# Patient Record
Sex: Male | Born: 1965 | Race: White | Hispanic: No | Marital: Single | Smoking: Former smoker
Health system: Southern US, Community
[De-identification: ages and names within clinical notes are randomized; demographics above are authoritative.]

## PROBLEM LIST (undated history)

## (undated) ENCOUNTER — Emergency Department (HOSPITAL_COMMUNITY): Payer: Self-pay

## (undated) DIAGNOSIS — E119 Type 2 diabetes mellitus without complications: Secondary | ICD-10-CM

## (undated) DIAGNOSIS — K219 Gastro-esophageal reflux disease without esophagitis: Secondary | ICD-10-CM

## (undated) DIAGNOSIS — I10 Essential (primary) hypertension: Secondary | ICD-10-CM

## (undated) DIAGNOSIS — F419 Anxiety disorder, unspecified: Secondary | ICD-10-CM

## (undated) DIAGNOSIS — R011 Cardiac murmur, unspecified: Secondary | ICD-10-CM

## (undated) HISTORY — DX: Essential (primary) hypertension: I10

## (undated) HISTORY — DX: Anxiety disorder, unspecified: F41.9

## (undated) HISTORY — DX: Type 2 diabetes mellitus without complications: E11.9

## (undated) HISTORY — DX: Cardiac murmur, unspecified: R01.1

## (undated) HISTORY — DX: Gastro-esophageal reflux disease without esophagitis: K21.9

## (undated) HISTORY — PX: FRACTURE SURGERY: SHX138

---

## 1998-07-19 ENCOUNTER — Emergency Department (HOSPITAL_COMMUNITY): Admission: EM | Admit: 1998-07-19 | Discharge: 1998-07-19 | Payer: Self-pay | Admitting: Emergency Medicine

## 1998-07-19 ENCOUNTER — Encounter: Payer: Self-pay | Admitting: Emergency Medicine

## 2005-05-20 ENCOUNTER — Emergency Department (HOSPITAL_COMMUNITY): Admission: EM | Admit: 2005-05-20 | Discharge: 2005-05-21 | Payer: Self-pay | Admitting: *Deleted

## 2006-05-07 ENCOUNTER — Emergency Department (HOSPITAL_COMMUNITY): Admission: EM | Admit: 2006-05-07 | Discharge: 2006-05-07 | Payer: Self-pay | Admitting: Emergency Medicine

## 2008-01-06 IMAGING — CR DG ELBOW COMPLETE 3+V*L*
4 series · 4 of 4 positions shown · non-contrast
Comparison: none

CLINICAL DATA: Fall, pain and swelling.
LEFT ELBOW ? 4 VIEW:

[x elbow joint ap left]
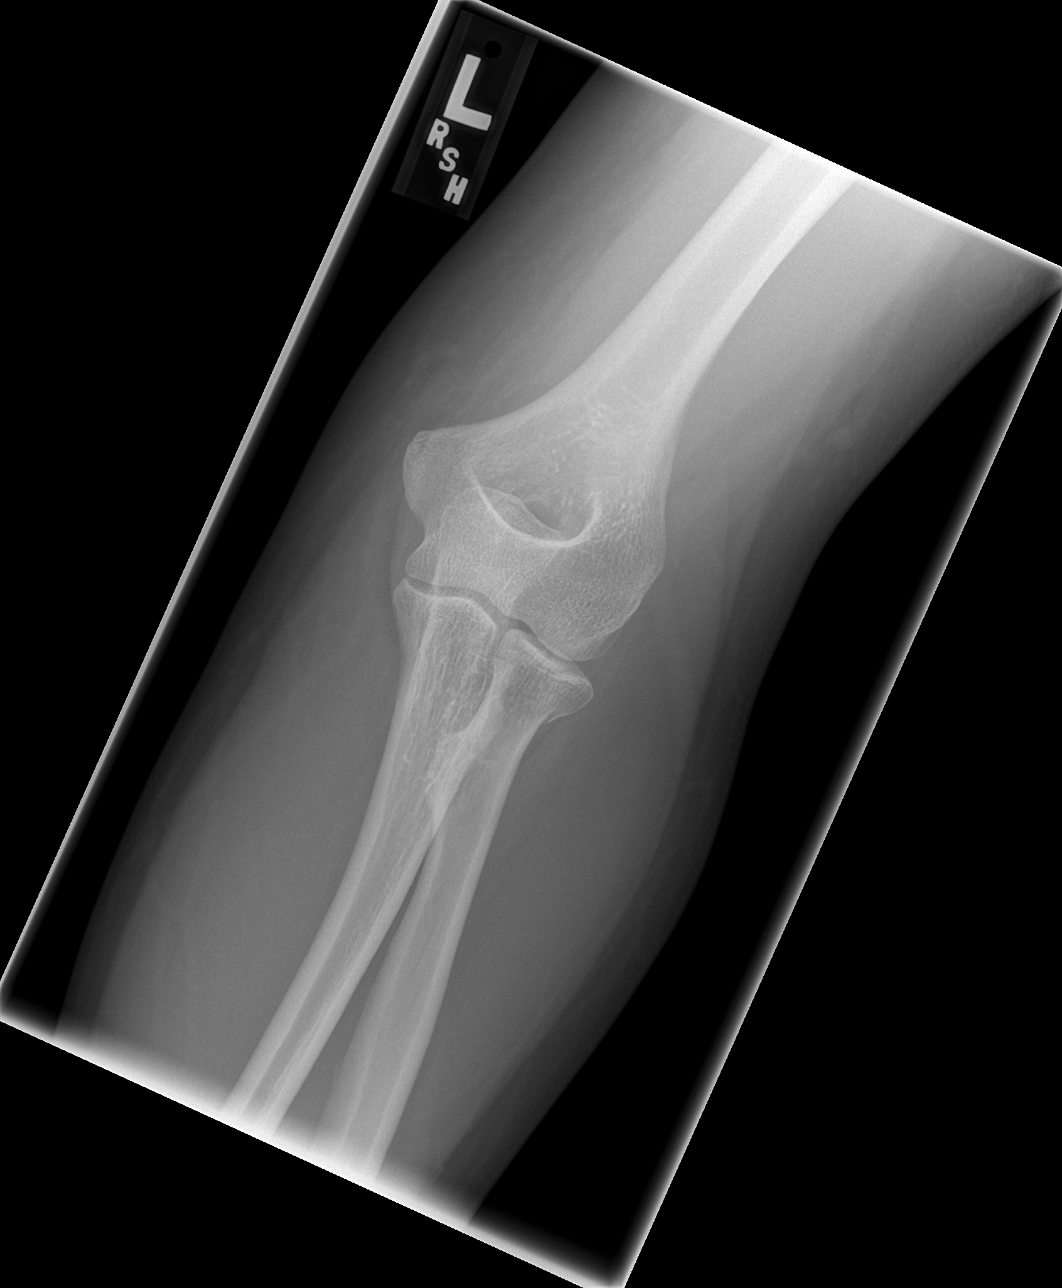

[x elbow joint obl. left (1 of 2)]
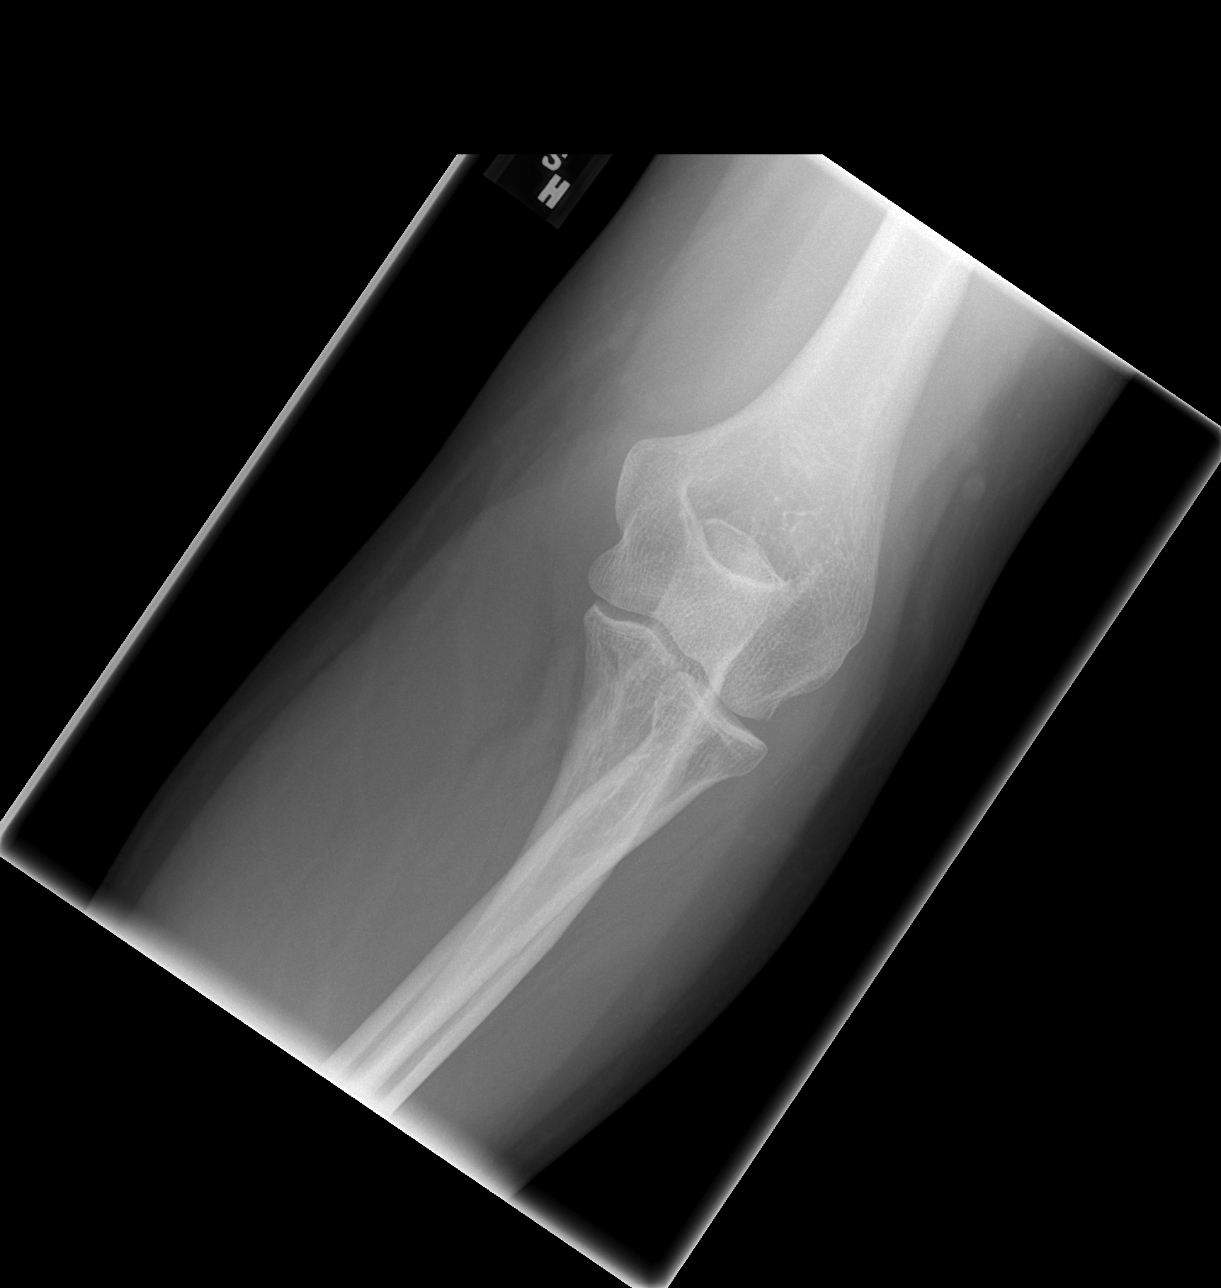

[x elbow joint obl. left (2 of 2)]
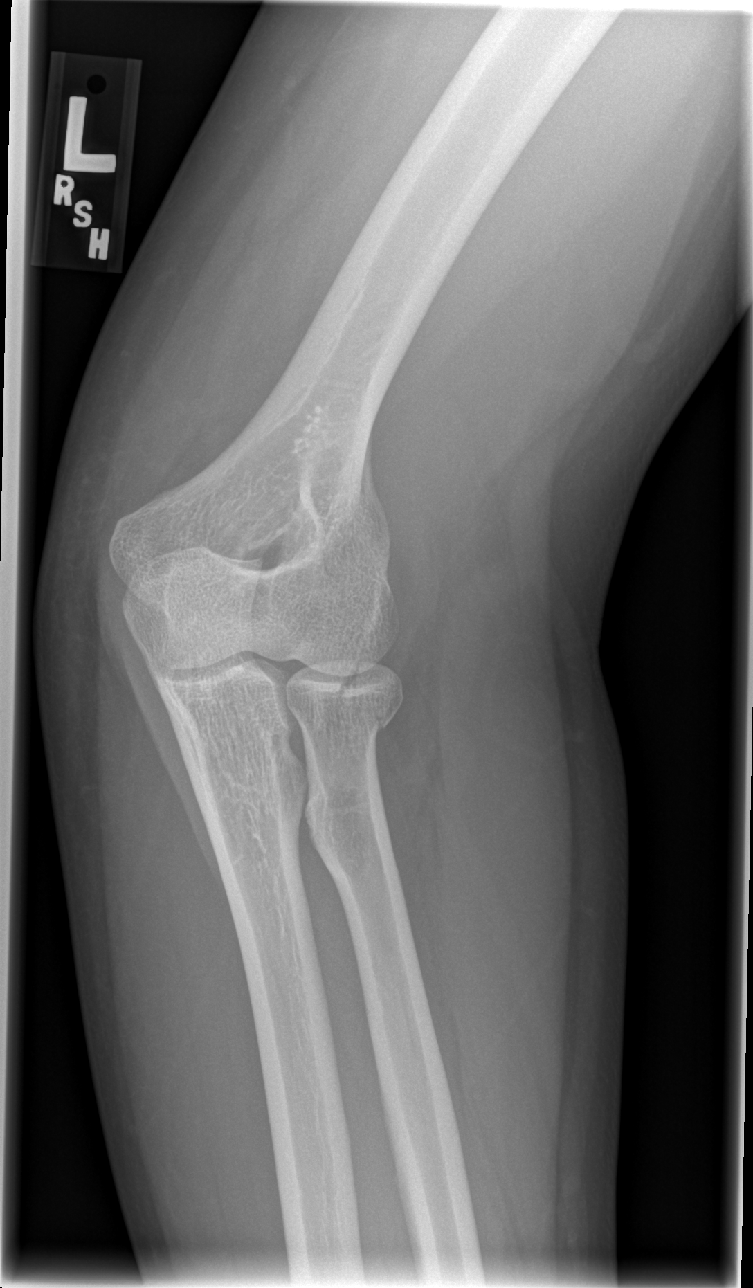

[x elbow joint lat left]
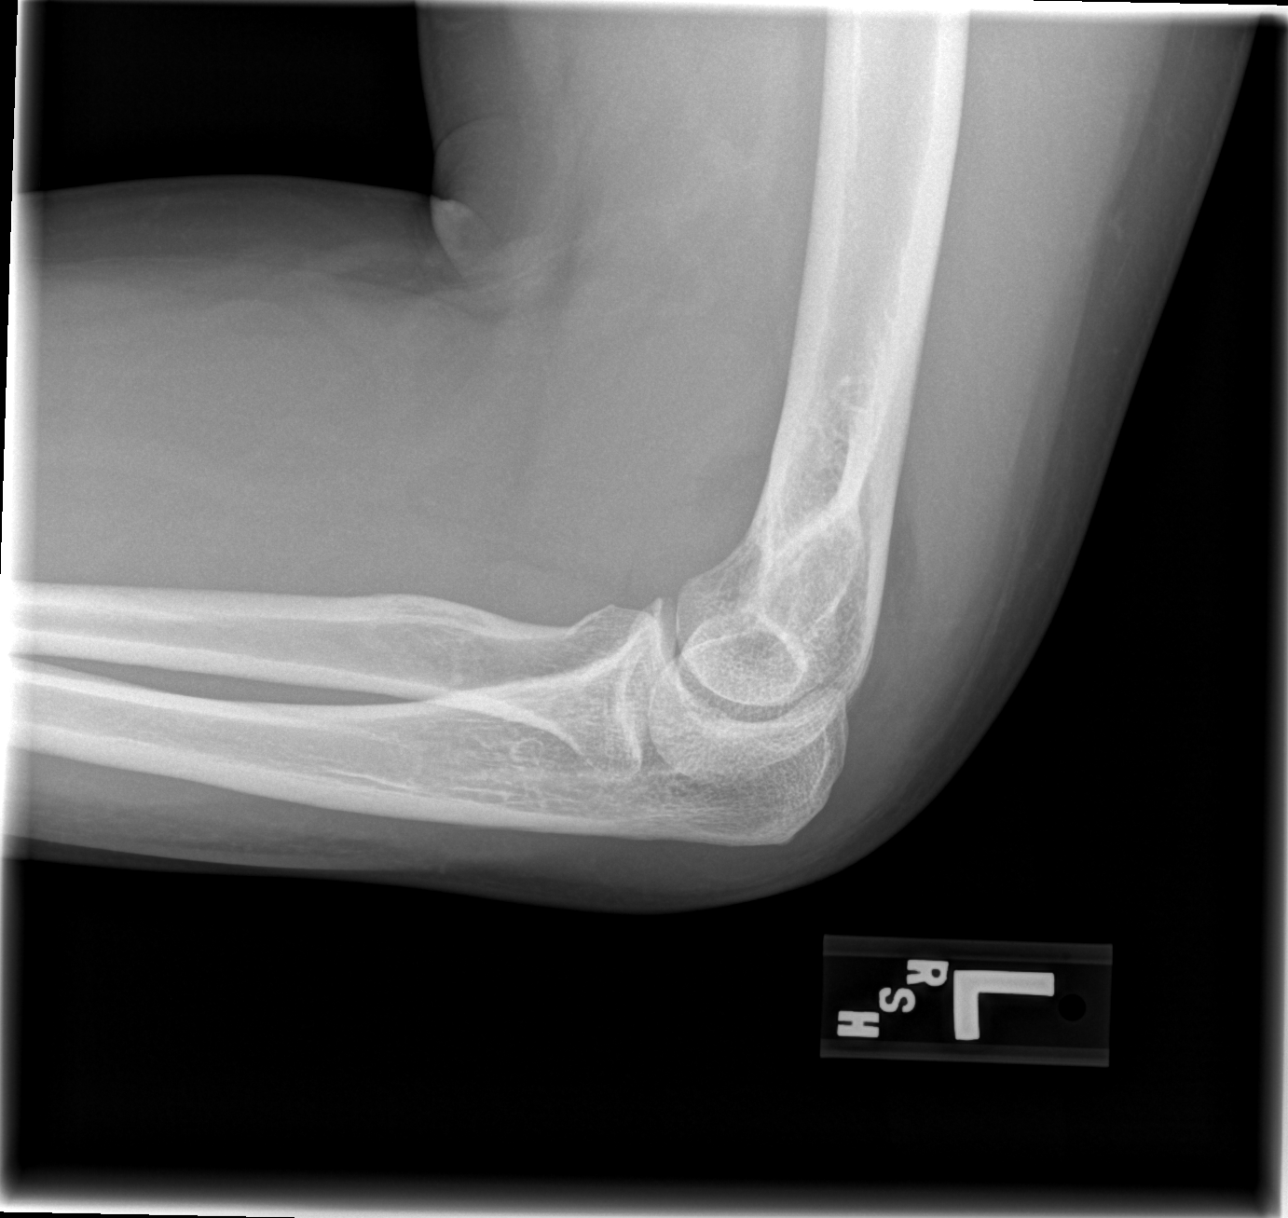

[4 of 4 positions shown; findings below may reference images not displayed]

FINDINGS: There is an acute minimally displaced fracture of the radial head. Alignment is anatomic to the capitellum. The humerus and ulna are intact and aligned. On the lateral view, there is an acute joint effusion/hemarthrosis.
IMPRESSION: Acute radial head fracture with joint effusion.

## 2014-12-08 ENCOUNTER — Ambulatory Visit (INDEPENDENT_AMBULATORY_CARE_PROVIDER_SITE_OTHER): Payer: No Typology Code available for payment source | Admitting: Emergency Medicine

## 2014-12-08 ENCOUNTER — Other Ambulatory Visit: Payer: Self-pay | Admitting: Emergency Medicine

## 2014-12-08 VITALS — BP 184/100 | HR 101 | Temp 97.7°F | Resp 16 | Ht 67.0 in | Wt 260.4 lb

## 2014-12-08 DIAGNOSIS — G44039 Episodic paroxysmal hemicrania, not intractable: Secondary | ICD-10-CM

## 2014-12-08 DIAGNOSIS — E118 Type 2 diabetes mellitus with unspecified complications: Secondary | ICD-10-CM

## 2014-12-08 DIAGNOSIS — E1165 Type 2 diabetes mellitus with hyperglycemia: Secondary | ICD-10-CM

## 2014-12-08 DIAGNOSIS — I10 Essential (primary) hypertension: Secondary | ICD-10-CM | POA: Diagnosis not present

## 2014-12-08 DIAGNOSIS — G479 Sleep disorder, unspecified: Secondary | ICD-10-CM

## 2014-12-08 DIAGNOSIS — E119 Type 2 diabetes mellitus without complications: Secondary | ICD-10-CM | POA: Insufficient documentation

## 2014-12-08 DIAGNOSIS — IMO0002 Reserved for concepts with insufficient information to code with codable children: Secondary | ICD-10-CM

## 2014-12-08 LAB — POCT CBC
Granulocyte percent: 68.1 %G (ref 37–80)
HCT, POC: 50.7 % (ref 43.5–53.7)
Hemoglobin: 17.3 g/dL (ref 14.1–18.1)
Lymph, poc: 1.7 (ref 0.6–3.4)
MCH, POC: 31.6 pg — AB (ref 27–31.2)
MCHC: 34.2 g/dL (ref 31.8–35.4)
MCV: 92.5 fL (ref 80–97)
MID (cbc): 0.2 (ref 0–0.9)
MPV: 7.9 fL (ref 0–99.8)
POC Granulocyte: 4.1 (ref 2–6.9)
POC LYMPH PERCENT: 27.8 %L (ref 10–50)
POC MID %: 4.1 %M (ref 0–12)
Platelet Count, POC: 204 10*3/uL (ref 142–424)
RBC: 5.48 M/uL (ref 4.69–6.13)
RDW, POC: 13 %
WBC: 6 10*3/uL (ref 4.6–10.2)

## 2014-12-08 LAB — COMPLETE METABOLIC PANEL WITH GFR
ALT: 90 U/L — ABNORMAL HIGH (ref 9–46)
AST: 47 U/L — ABNORMAL HIGH (ref 10–40)
Albumin: 4.7 g/dL (ref 3.6–5.1)
Alkaline Phosphatase: 67 U/L (ref 40–115)
BUN: 11 mg/dL (ref 7–25)
CO2: 24 mmol/L (ref 20–31)
Calcium: 9.8 mg/dL (ref 8.6–10.3)
Chloride: 99 mmol/L (ref 98–110)
Creat: 0.94 mg/dL (ref 0.60–1.35)
GFR, Est African American: >89 mL/min (ref >=60)
GFR, Est Non African American: >89 mL/min (ref >=60)
Glucose, Bld: 374 mg/dL — ABNORMAL HIGH (ref 65–99)
Potassium: 4.5 mmol/L (ref 3.5–5.3)
Sodium: 136 mmol/L (ref 135–146)
Total Bilirubin: 1.1 mg/dL (ref 0.2–1.2)
Total Protein: 6.8 g/dL (ref 6.1–8.1)

## 2014-12-08 LAB — HEMOGLOBIN A1C: HEMOGLOBIN A1C: 10.3 % — AB (ref 4.0–6.0)

## 2014-12-08 LAB — GLUCOSE, POCT (MANUAL RESULT ENTRY): POC Glucose: 398 mg/dl — AB (ref 70–99)

## 2014-12-08 LAB — TSH: TSH: 3.614 u[IU]/mL (ref 0.350–4.500)

## 2014-12-08 LAB — POCT GLYCOSYLATED HEMOGLOBIN (HGB A1C): HEMOGLOBIN A1C: 10.3

## 2014-12-08 MED ORDER — DILTIAZEM HCL ER COATED BEADS 240 MG PO CP24: 240.0000 mg | ORAL_CAPSULE | Freq: Every day | ORAL | Status: DC

## 2014-12-08 MED ORDER — METFORMIN HCL 500 MG PO TABS: ORAL_TABLET | ORAL | Status: DC

## 2014-12-08 NOTE — Patient Instructions (Signed)
Hypertension Hypertension, commonly called high blood pressure, is when the force of blood pumping through your arteries is too strong. Your arteries are the blood vessels that carry blood from your heart throughout your body. A blood pressure reading consists of a higher number over a lower number, such as 110/72. The higher number (systolic) is the pressure inside your arteries when your heart pumps. The lower number (diastolic) is the pressure inside your arteries when your heart relaxes. Ideally you want your blood pressure below 120/80. Hypertension forces your heart to work harder to pump blood. Your arteries may become narrow or stiff. Having untreated or uncontrolled hypertension can cause heart attack, stroke, kidney disease, and other problems. RISK FACTORS Some risk factors for high blood pressure are controllable. Others are not.  Risk factors you cannot control include:   Race. You may be at higher risk if you are African American.  Age. Risk increases with age.  Gender. Men are at higher risk than women before age 45 years. After age 65, women are at higher risk than men. Risk factors you can control include:  Not getting enough exercise or physical activity.  Being overweight.  Getting too much fat, sugar, calories, or salt in your diet.  Drinking too much alcohol. SIGNS AND SYMPTOMS Hypertension does not usually cause signs or symptoms. Extremely high blood pressure (hypertensive crisis) may cause headache, anxiety, shortness of breath, and nosebleed. DIAGNOSIS To check if you have hypertension, your health care provider will measure your blood pressure while you are seated, with your arm held at the level of your heart. It should be measured at least twice using the same arm. Certain conditions can cause a difference in blood pressure between your right and left arms. A blood pressure reading that is higher than normal on one occasion does not mean that you need treatment. If  it is not clear whether you have high blood pressure, you may be asked to return on a different day to have your blood pressure checked again. Or, you may be asked to monitor your blood pressure at home for 1 or more weeks. TREATMENT Treating high blood pressure includes making lifestyle changes and possibly taking medicine. Living a healthy lifestyle can help lower high blood pressure. You may need to change some of your habits. Lifestyle changes may include:  Following the DASH diet. This diet is high in fruits, vegetables, and whole grains. It is low in salt, red meat, and added sugars.  Keep your sodium intake below 2,300 mg per day.  Getting at least 30-45 minutes of aerobic exercise at least 4 times per week.  Losing weight if necessary.  Not smoking.  Limiting alcoholic beverages.  Learning ways to reduce stress. Your health care provider may prescribe medicine if lifestyle changes are not enough to get your blood pressure under control, and if one of the following is true:  You are 18-59 years of age and your systolic blood pressure is above 140.  You are 60 years of age or older, and your systolic blood pressure is above 150.  Your diastolic blood pressure is above 90.  You have diabetes, and your systolic blood pressure is over 140 or your diastolic blood pressure is over 90.  You have kidney disease and your blood pressure is above 140/90.  You have heart disease and your blood pressure is above 140/90. Your personal target blood pressure may vary depending on your medical conditions, your age, and other factors. HOME CARE INSTRUCTIONS    Have your blood pressure rechecked as directed by your health care provider.   Take medicines only as directed by your health care provider. Follow the directions carefully. Blood pressure medicines must be taken as prescribed. The medicine does not work as well when you skip doses. Skipping doses also puts you at risk for  problems.  Do not smoke.   Monitor your blood pressure at home as directed by your health care provider. SEEK MEDICAL CARE IF:   You think you are having a reaction to medicines taken.  You have recurrent headaches or feel dizzy.  You have swelling in your ankles.  You have trouble with your vision. SEEK IMMEDIATE MEDICAL CARE IF:  You develop a severe headache or confusion.  You have unusual weakness, numbness, or feel faint.  You have severe chest or abdominal pain.  You vomit repeatedly.  You have trouble breathing. MAKE SURE YOU:   Understand these instructions.  Will watch your condition.  Will get help right away if you are not doing well or get worse.   This information is not intended to replace advice given to you by your health care provider. Make sure you discuss any questions you have with your health care provider.   Document Released: 01/06/2005 Document Revised: 05/23/2014 Document Reviewed: 10/29/2012 Elsevier Interactive Patient Education 2016 [employer]vier [employer] Type 2 Diabetes Mellitus, Adult Type 2 diabetes mellitus, often simply referred to as type 2 diabetes, is a long-lasting (chronic) disease. In type 2 diabetes, the pancreas does not make enough insulin (a hormone), the cells are less responsive to the insulin that is made (insulin resistance), or both. Normally, insulin moves sugars from food into the tissue cells. The tissue cells use the sugars for energy. The lack of insulin or the lack of normal response to insulin causes excess sugars to build up in the blood instead of going into the tissue cells. As a result, high blood sugar (hyperglycemia) develops. The effect of high sugar (glucose) levels can cause many complications. Type 2 diabetes was also previously called adult-onset diabetes, but it can occur at any age.  RISK FACTORS  A person is predisposed to developing type 2 diabetes if someone in the family has the disease and also has one or  more of the following primary risk factors:  Weight gain, or being overweight or obese.  An inactive lifestyle.  A history of consistently eating high-calorie foods. Maintaining a normal weight and regular physical activity can reduce the chance of developing type 2 diabetes. SYMPTOMS  A person with type 2 diabetes may not show symptoms initially. The symptoms of type 2 diabetes appear slowly. The symptoms include:  Increased thirst (polydipsia).  Increased urination (polyuria).  Increased urination during the night (nocturia).  Sudden or unexplained weight changes.  Frequent, recurring infections.  Tiredness (fatigue).  Weakness.  Vision changes, such as blurred vision.  Fruity smell to your breath.  Abdominal pain.  Nausea or vomiting.  Cuts or bruises which are slow to heal.  Tingling or numbness in the hands or feet.  An open skin wound (ulcer). DIAGNOSIS Type 2 diabetes is frequently not diagnosed until complications of diabetes are present. Type 2 diabetes is diagnosed when symptoms or complications are present and when blood glucose levels are increased. Your blood glucose level may be checked by one or more of the following blood tests:  A fasting blood glucose test. You will not be allowed to eat for at least 8 hours before a blood sample   is taken.  A random blood glucose test. Your blood glucose is checked at any time of the day regardless of when you ate.  A hemoglobin A1c blood glucose test. A hemoglobin A1c test provides information about blood glucose control over the previous 3 months.  An oral glucose tolerance test (OGTT). Your blood glucose is measured after you have not eaten (fasted) for 2 hours and then after you drink a glucose-containing beverage. TREATMENT   You may need to take insulin or diabetes medicine daily to keep blood glucose levels in the desired range.  If you use insulin, you may need to adjust the dosage depending on the  carbohydrates that you eat with each meal or snack.  Lifestyle changes are recommended as part of your treatment. These may include:  Following an individualized diet plan developed by a nutritionist or dietitian.  Exercising daily. Your health care providers will set individualized treatment goals for you based on your age, your medicines, how long you have had diabetes, and any other medical conditions you have. Generally, the goal of treatment is to maintain the following blood glucose levels:  Before meals (preprandial): 80-130 mg/dL.  After meals (postprandial): below 180 mg/dL.  A1c: less than 6.5-7%. HOME CARE INSTRUCTIONS   Have your hemoglobin A1c level checked twice a year.  Perform daily blood glucose monitoring as directed by your health care provider.  Monitor urine ketones when you are ill and as directed by your health care provider.  Take your diabetes medicine or insulin as directed by your health care provider to maintain your blood glucose levels in the desired range.  Never run out of diabetes medicine or insulin. It is needed every day.  If you are using insulin, you may need to adjust the amount of insulin given based on your intake of carbohydrates. Carbohydrates can raise blood glucose levels but need to be included in your diet. Carbohydrates provide vitamins, minerals, and fiber which are an essential part of a healthy diet. Carbohydrates are found in fruits, vegetables, whole grains, dairy products, legumes, and foods containing added sugars.  Eat healthy foods. You should make an appointment to see a registered dietitian to help you create an eating plan that is right for you.  Lose weight if you are overweight.  Carry a medical alert card or wear your medical alert jewelry.  Carry a 15-gram carbohydrate snack with you at all times to treat low blood glucose (hypoglycemia). Some examples of 15-gram carbohydrate snacks include:  Glucose tablets, 3 or  4.  Glucose gel, 15-gram tube.  Raisins, 2 tablespoons (24 grams).  Jelly beans, 6.  Animal crackers, 8.  Regular pop, 4 ounces (120 mL).  Gummy treats, 9.  Recognize hypoglycemia. Hypoglycemia occurs with blood glucose levels of 70 mg/dL and below. The risk for hypoglycemia increases when fasting or skipping meals, during or after intense exercise, and during sleep. Hypoglycemia symptoms can include:  Tremors or shakes.  Decreased ability to concentrate.  Sweating.  Increased heart rate.  Headache.  Dry mouth.  Hunger.  Irritability.  Anxiety.  Restless sleep.  Altered speech or coordination.  Confusion.  Treat hypoglycemia promptly. If you are alert and able to safely swallow, follow the 15:15 rule:  Take 15-20 grams of rapid-acting glucose or carbohydrate. Rapid-acting options include glucose gel, glucose tablets, or 4 ounces (120 mL) of fruit juice, regular soda, or low-fat milk.  Check your blood glucose level 15 minutes after taking the glucose.  Take 15-20 grams more   of glucose if the repeat blood glucose level is still 70 mg/dL or below.  Eat a meal or snack within 1 hour once blood glucose levels return to normal.  Be alert to feeling very thirsty and urinating more frequently than usual, which are early signs of hyperglycemia. An early awareness of hyperglycemia allows for prompt treatment. Treat hyperglycemia as directed by your health care provider.  Engage in at least 150 minutes of moderate-intensity physical activity a week, spread over at least 3 days of the week or as directed by your health care provider. In addition, you should engage in resistance exercise at least 2 times a week or as directed by your health care provider. Try to spend no more than 90 minutes at one time inactive.  Adjust your medicine and food intake as needed if you start a new exercise or sport.  Follow your sick-day plan anytime you are unable to eat or drink as  usual.  Do not use any tobacco products including cigarettes, chewing tobacco, or electronic cigarettes. If you need help quitting, ask your health care provider.  Limit alcohol intake to no more than 1 drink per day for nonpregnant women and 2 drinks per day for men. You should drink alcohol only when you are also eating food. Talk with your health care provider whether alcohol is safe for you. Tell your health care provider if you drink alcohol several times a week.  Keep all follow-up visits as directed by your health care provider. This is important.  Schedule an eye exam soon after the diagnosis of type 2 diabetes and then annually.  Perform daily skin and foot care. Examine your skin and feet daily for cuts, bruises, redness, nail problems, bleeding, blisters, or sores. A foot exam by a health care provider should be done annually.  Brush your teeth and gums at least twice a day and floss at least once a day. Follow up with your dentist regularly.  Share your diabetes management plan with your workplace or school.  Keep your immunizations up to date. It is recommended that you receive a flu (influenza) vaccine every year. It is also recommended that you receive a pneumonia (pneumococcal) vaccine. If you are 65 years of age or older and have never received a pneumonia vaccine, this vaccine may be given as a series of two separate shots. Ask your health care provider which additional vaccines may be recommended.  Learn to manage stress.  Obtain ongoing diabetes education and support as needed.  Participate in or seek rehabilitation as needed to maintain or improve independence and quality of life. Request a physical or occupational therapy referral if you are having foot or hand numbness, or difficulties with grooming, dressing, eating, or physical activity. SEEK MEDICAL CARE IF:   You are unable to eat food or drink fluids for more than 6 hours.  You have nausea and vomiting for more  than 6 hours.  Your blood glucose level is over 240 mg/dL.  There is a change in mental status.  You develop an additional serious illness.  You have diarrhea for more than 6 hours.  You have been sick or have had a fever for a couple of days and are not getting better.  You have pain during any physical activity.  SEEK IMMEDIATE MEDICAL CARE IF:  You have difficulty breathing.  You have moderate to large ketone levels.   This information is not intended to replace advice given to you by your health care   provider. Make sure you discuss any questions you have with your health care provider.   Document Released: 01/06/2005 Document Revised: 09/27/2014 Document Reviewed: 08/05/2011 Elsevier Interactive Patient Education 2016 Elsevier Inc.  

## 2014-12-08 NOTE — Progress Notes (Addendum)
This chart was scribed for Lesle ChrisSteven Taren Toops, MD by Broadus Johnawaa Al Rifaie, Medical Scribe. This patient was seen in Room 12 and the patient's care was started at 11:35 AM.  Chief Complaint:  Chief Complaint  Patient presents with  . elevated blood pressure    last time he checked it it was 180/100  . Dizziness  . Headache    HPI: Clifford Mason is a 49 y.o. male [facility] complaining of elevated blood pressure.  Pt reports associated symptoms of mild headaches of 2-3 severity level, cloudiness sensation, blurred vision with a similar feeling as if a light was flashed at his eye, dizziness. He indicates that he gets the symptoms about 3-5 times a week that usually last from 5-60 minutes. He does not take any medications for the symptoms, and does not check his blood pressure regularly. Pt indicates that he used to have very severe headaches, however the current headaches are much less in severity than the prior ones; he usually finds relief with taking Ibuprofen for them. Pt reports that he was never diagnosed with migraines. Pt was told that he does snore and cough while sleeping. Pt does have a history of maternal HTN.   Pt reports that he has been over weight for the past 20 years, and he has been trying to lose it however with no success.   Pt notes that he quit smoking 2 years ago. Pt usually drinks about 1 or 2 drinks per day. Pt denies using drugs.   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   Past Medical History  Diagnosis Date  . Heart murmur   . Hypertension    History reviewed. No pertinent past surgical history. Social History   Social History  . Marital Status: Single    Spouse Name: N/A  . Number of Children: N/A  . Years of Education: N/A   Social History Main Topics  . Smoking status: Never Smoker   . Smokeless tobacco: None  . Alcohol Use: 0.0 oz/week    0 Standard drinks or equivalent per week  . Drug Use: None  . Sexual Activity: Not Asked   Other  Topics Concern  . None   Social History Narrative  . None   History reviewed. No pertinent family history. No Known Allergies Prior to Admission medications   Not on File     ROS: The patient has headaches, blurred vision.  Patient denies fevers, chills, night sweats, unintentional weight loss, chest pain, palpitations, wheezing, dyspnea on exertion, nausea, vomiting, abdominal pain, dysuria, hematuria, melena, numbness, weakness, or tingling.   All other systems have been reviewed and were otherwise negative with the exception of those mentioned in the HPI and as above.    PHYSICAL EXAM: Filed Vitals:   12/08/14 1130  BP: 184/100  Pulse: 101  Temp: 97.7 F (36.5 C)  Resp: 16   Body mass index is 40.77 kg/(m^2).   General: Alert, no acute distress HEENT:  Normocephalic, atraumatic, oropharynx patent. [facility]. Disc margines are sharp, Cardiovascular:  Regular rate and rhythm, no rubs murmurs or gallops.  No Carotid bruits, radial pulse intact. No pedal edema.  Respiratory: Clear to auscultation bilaterally.  No wheezes, rales, or rhonchi.  No cyanosis, no use of accessory musculature Abdominal: No organomegaly, abdomen is soft and non-tender, positive bowel sounds.  No masses. Abdomen is protuberant. Musculoskeletal: Gait intact. No edema, tenderness. Thoracic kyphoscoliosis. Skin: No rashes. Neurologic: Facial musculature symmetric. Cranial nerves  are normal. Psychiatric: Patient acts appropriately throughout our interaction. Lymphatic: No cervical or submandibular lymphadenopathy  Blood pressure: Right arm/resting- 164/106, Left arm/resting- 164/104   LABS:  Results for orders placed or performed in visit on 12/08/14  POCT CBC  Result Value Ref Range   WBC 6.0 4.6 - 10.2 K/uL   Lymph, poc 1.7 0.6 - 3.4   POC LYMPH PERCENT 27.8 10 - 50 %L   MID (cbc) 0.2 0 - 0.9   POC MID % 4.1 0 - 12 %M   POC Granulocyte 4.1 2 - 6.9   Granulocyte percent 68.1 37 -  80 %G   RBC 5.48 4.69 - 6.13 M/uL   Hemoglobin 17.3 14.1 - 18.1 g/dL   HCT, POC 16.1 09.6 - 53.7 %   MCV 92.5 80 - 97 fL   MCH, POC 31.6 (A) 27 - 31.2 pg   MCHC 34.2 31.8 - 35.4 g/dL   RDW, POC 04.5 %   Platelet Count, POC 204 142 - 424 K/uL   MPV 7.9 0 - 99.8 fL  POCT glucose (manual entry)  Result Value Ref Range   POC Glucose 398 (A) 70 - 99 mg/dl   Results for orders placed or performed in visit on 12/08/14  POCT CBC  Result Value Ref Range   WBC 6.0 4.6 - 10.2 K/uL   Lymph, poc 1.7 0.6 - 3.4   POC LYMPH PERCENT 27.8 10 - 50 %L   MID (cbc) 0.2 0 - 0.9   POC MID % 4.1 0 - 12 %M   POC Granulocyte 4.1 2 - 6.9   Granulocyte percent 68.1 37 - 80 %G   RBC 5.48 4.69 - 6.13 M/uL   Hemoglobin 17.3 14.1 - 18.1 g/dL   HCT, POC 40.9 81.1 - 53.7 %   MCV 92.5 80 - 97 fL   MCH, POC 31.6 (A) 27 - 31.2 pg   MCHC 34.2 31.8 - 35.4 g/dL   RDW, POC 91.4 %   Platelet Count, POC 204 142 - 424 K/uL   MPV 7.9 0 - 99.8 fL  POCT glucose (manual entry)  Result Value Ref Range   POC Glucose 398 (A) 70 - 99 mg/dl  POCT glycosylated hemoglobin (Hb A1C)  Result Value Ref Range   Hemoglobin A1C 10.3     EKG/XRAY:   Primary read interpreted by Dr. Cleta Alberts at [facility]. Normal sinus rhythm. J-point depression in lead aVF.   ASSESSMENT/PLAN:  patient is undergoing diagnosed diabetic with hypertension. We'll start with metformin and  For his diabetes.. Plan to add Cardizem and see if that cuts down on his headaches. CT scan was ordered as well as a sleep study. Hemoglobin A1c returned at 10.3.Marland Kitchen  By signing my name below, I, Rawaa Al Rifaie, attest that this documentation has been prepared under the direction and in the presence of [name], MD.  Broadus John, Medical Scribe. 12/08/2014.  11:50 AM.     Michaell Cowing sideeffects, risk and benefits, and alternatives of medications d/w patient. Patient is aware that all medications have potential sideeffects and we are unable to predict every sideeffect or  drug-drug interaction that may occur.  [name] MD 12/08/2014 11:50 AM

## 2014-12-09 LAB — MICROALBUMIN, URINE: Microalb, Ur: 1.9 mg/dL

## 2014-12-12 LAB — HEPATITIS C ANTIBODY: HCV Ab: NEGATIVE

## 2014-12-12 LAB — HEPATITIS B SURFACE ANTIGEN: Hepatitis B Surface Ag: NEGATIVE

## 2014-12-20 ENCOUNTER — Encounter: Payer: Self-pay | Admitting: Family Medicine

## 2015-01-01 ENCOUNTER — Ambulatory Visit (INDEPENDENT_AMBULATORY_CARE_PROVIDER_SITE_OTHER): Payer: No Typology Code available for payment source | Admitting: Neurology

## 2015-01-01 ENCOUNTER — Encounter: Payer: Self-pay | Admitting: Neurology

## 2015-01-01 VITALS — BP 150/104 | HR 82 | Resp 20 | Ht 67.0 in | Wt 253.0 lb

## 2015-01-01 DIAGNOSIS — G4730 Sleep apnea, unspecified: Secondary | ICD-10-CM

## 2015-01-01 DIAGNOSIS — G4710 Hypersomnia, unspecified: Secondary | ICD-10-CM | POA: Diagnosis not present

## 2015-01-01 DIAGNOSIS — R0683 Snoring: Secondary | ICD-10-CM | POA: Diagnosis not present

## 2015-01-01 DIAGNOSIS — F4321 Adjustment disorder with depressed mood: Secondary | ICD-10-CM | POA: Diagnosis not present

## 2015-01-01 DIAGNOSIS — G4726 Circadian rhythm sleep disorder, shift work type: Secondary | ICD-10-CM | POA: Diagnosis not present

## 2015-01-01 MED ORDER — PAROXETINE HCL 10 MG PO TABS
10.0000 mg | ORAL_TABLET | Freq: Every day | ORAL | Status: DC
Start: 2015-01-01 — End: 2015-02-01

## 2015-01-01 NOTE — Patient Instructions (Signed)
Paroxetine tablets  What is this medicine?  PAROXETINE (pa ROX e teen) is used to treat depression. It may also be used to treat anxiety disorders, obsessive compulsive disorder, panic attacks, post traumatic stress, and premenstrual dysphoric disorder (PMDD).  This medicine may be used for other purposes; ask your health care provider or pharmacist if you have questions.  What should I tell my health care provider before I take this medicine?  They need to know if you have any of these conditions:  -bleeding disorders  -glaucoma  -heart disease  -kidney disease  -liver disease  -low levels of sodium in the blood  -mania or bipolar disorder  -seizures  -suicidal thoughts, plans, or attempt; a previous suicide attempt by you or a family member  -take MAOIs like Carbex, Eldepryl, Marplan, Nardil, and Parnate  -take medicines that treat or prevent blood clots  -an unusual or allergic reaction to paroxetine, other medicines, foods, dyes, or preservatives  -pregnant or trying to get pregnant  -breast-feeding  How should I use this medicine?  Take this medicine by mouth with a glass of water. Follow the directions on the prescription label. You can take it with or without food. Take your medicine at regular intervals. Do not take your medicine more often than directed. Do not stop taking this medicine suddenly except upon the advice of your doctor. Stopping this medicine too quickly may cause serious side effects or your condition may worsen.  A special MedGuide will be given to you by the pharmacist with each prescription and refill. Be sure to read this information carefully each time.  Talk to your pediatrician regarding the use of this medicine in children. Special care may be needed.  Overdosage: If you think you have taken too much of this medicine contact a poison control center or emergency room at once.  NOTE: This medicine is only for you. Do not share this medicine with others.  What if I miss a dose?  If you  miss a dose, take it as soon as you can. If it is almost time for your next dose, take only that dose. Do not take double or extra doses.  What may interact with this medicine?  Do not take this medicine with any of the following medications:  -linezolid  -MAOIs like Carbex, Eldepryl, Marplan, Nardil, and Parnate  -methylene blue (injected into a vein)  -pimozide  -thioridazine  This medicine may also interact with the following medications:  -alcohol  -aspirin and aspirin-like medicines  -atomoxetine  -certain medicines for depression, anxiety, or psychotic disturbances  -certain medicines for irregular heart beat like propafenone, flecainide, encainide, and quinidine  -certain medicines for migraine headache like almotriptan, eletriptan, frovatriptan, naratriptan, rizatriptan, sumatriptan, zolmitriptan  -cimetidine  -digoxin  -diuretics  -fentanyl  -fosamprenavir/ritonavir  -furazolidone  -isoniazid  -lithium  -medicines that treat or prevent blood clots like warfarin, enoxaparin, and dalteparin  -medicines for sleep  -metoprolol  -NSAIDs, medicines for pain and inflammation, like ibuprofen or naproxen  -phenobarbital  -phenytoin  -procarbazine  -procyclidine  -rasagiline  -supplements like St. John's wort, kava kava, valerian  -tamoxifen  -theophylline  -tramadol  -tryptophan  This list may not describe all possible interactions. Give your health care provider a list of all the medicines, herbs, non-prescription drugs, or dietary supplements you use. Also tell them if you smoke, drink alcohol, or use illegal drugs. Some items may interact with your medicine.  What should I watch for while using this medicine?    Tell your doctor if your symptoms do not get better or if they get worse. Visit your doctor or health care professional for regular checks on your progress. Because it may take several weeks to see the full effects of this medicine, it is important to continue your treatment as prescribed by your  doctor.  Patients and their families should watch out for new or worsening thoughts of suicide or depression. Also watch out for sudden changes in feelings such as feeling anxious, agitated, panicky, irritable, hostile, aggressive, impulsive, severely restless, overly excited and hyperactive, or not being able to sleep. If this happens, especially at the beginning of treatment or after a change in dose, call your health care professional.  You may get drowsy or dizzy. Do not drive, use machinery, or do anything that needs mental alertness until you know how this medicine affects you. Do not stand or sit up quickly, especially if you are an older patient. This reduces the risk of dizzy or fainting spells. Alcohol may interfere with the effect of this medicine. Avoid alcoholic drinks.  Your mouth may get dry. Chewing sugarless gum or sucking hard candy, and drinking plenty of water will help. Contact your doctor if the problem does not go away or is severe.  What side effects may I notice from receiving this medicine?  Side effects that you should report to your doctor or health care professional as soon as possible:  -allergic reactions like skin rash, itching or hives, swelling of the face, lips, or tongue  -black or bloody stools, blood in the urine or vomit  -fast, irregular heartbeat  -hallucination, loss of contact with reality  -painful or prolonged erection (men)  -seizures  -suicidal thoughts or other mood changes  -trouble passing urine or change in the amount of urine  -unusual bleeding or bruising  -unusually weak or tired  -vomiting  Side effects that usually do not require medical attention (report to your doctor or health care professional if they continue or are bothersome):  -change in appetite, weight  -change in sex drive or performance  -constipation or diarrhea  -difficulty sleeping  -drowsy  -headache  -increased sweating  -muscle pain or weakness  -tremors  This list may not describe all  possible side effects. Call your doctor for medical advice about side effects. You may report side effects to FDA at 1-800-FDA-1088.  Where should I keep my medicine?  Keep out of the reach of children.  Store at room temperature between 15 and 30 degrees C (59 and 86 degrees F). Keep container tightly closed. Throw away any unused medicine after the expiration date.  NOTE: This sheet is a summary. It may not cover all possible information. If you have questions about this medicine, talk to your doctor, pharmacist, or health care provider.     © 2016, [employer]. (2011-08-21 18:10:02)

## 2015-01-01 NOTE — Progress Notes (Signed)
SLEEP MED[facility]INE CLINIC   Provider:  Melvyn Novas, M D  Referring Provider: Collene Gobble, MD Primary Care Physician:  No primary care provider on file.  Chief Complaint  Patient presents with  . New Patient (Initial Visit)    dr. Cleta Alberts referral, snoring, rm 11, alone    HPI:  Clifford Mason is a 49 y.o. male, seen here as a referral  from Dr. Cleta Alberts for a sleep consultation,  Chief complaint according to patient : The patient just recently reinitiated contact us multiple specialties. He had not been seeing any physicians for about a decade. Was just diagnosed with hypertension, he quit smoking 2 years ago, he drinks 1 or 2 alcoholic beverages per day, he has gained weight over the last 10 years. He has a history of severe headaches. The patient is working third shift for about 20 years.   Sleep habits are as follows: Since the patient works night shifts he returns from work at about 8 AM, goes to sleep around 2 or 3 PM and then sleeps until 9:30 PM. Usually he can sleep well in daytime has no trouble falling asleep. He lives and sleeps alone, wakes up several times however, usually wakes about 3 times at night. He used to have multiple bathroom breaks. He prefers to sleep on his side, he sleeps in his bedroom. He has light blocking window curtains, his bedroom is cool, quiet and dark. He is moving during her sleep frequently. Infrequently when he can sleep at night for example on holiday break he may experience a night terror the feeling of a presence in the room that scares him and he will wake up with palpitations, diaphoresis. He would sometimes be leaving the bedroom and even finding himself outside before waking up. This hasn't happened a long time. He has been told that she snores and that he may irregularly breath during his sleep. When he is ready to go to work at around 9:30 PM he usually does not rely on an alarm. The last one or 2 hours of sleep are fragmented.   Sleep  medical history and family sleep history:  No known sleep apnea.  One sister - healthy    Social history: He works from 11 PM to 7 AM for the last 20 some years. Non smoker, quit 2 years age, 1/2 ppd.  ETOH daily.   Review of Systems: Out of a complete 14 system review, the patient complains of only the following symptoms, and all other reviewed systems are negative.   Epworth score 12 , Fatigue severity score 31 , depression score - PHq 2    Social History   Social History  . Marital Status: Single    Spouse Name: N/A  . Number of Children: N/A  . Years of Education: N/A   Occupational History  . Hotel    Social History Main Topics  . Smoking status: Never Smoker   . Smokeless tobacco: Not on file  . Alcohol Use: 0.0 oz/week    0 Standard drinks or equivalent per week  . Drug Use: Not on file  . Sexual Activity: Not on file   Other Topics Concern  . Not on file   Social History Narrative   Works third shift at a hotel.      Drinks occasional soda 1-2 week.    Family History  Problem Relation Age of Onset  . Healthy Mother     Past Medical History  Diagnosis Date  .  Heart murmur   . Hypertension   . Diabetes mellitus without complication (HCC)     History reviewed. No pertinent past surgical history.  Current Outpatient Prescriptions  Medication Sig Dispense Refill  . diltiazem (CARDIZEM CD) 240 MG 24 hr capsule Take 1 capsule (240 mg total) by mouth daily. 30 capsule 11  . metFORMIN (GLUCOPHAGE) 500 MG tablet Take 1 tablet daily for the first week then 1 twice a day. 60 tablet 11   No current facility-administered medications for this visit.    Allergies as of 01/01/2015  . (No Known Allergies)    Vitals: BP 150/104 mmHg  Pulse 82  Resp 20  Ht  (1.702 m)  Wt 253 lb (114.76 kg)  BMI 39.62 kg/m2 Last Weight:  Wt Readings from Last 1 Encounters:  01/01/15 253 lb (114.76 kg)   JXB:JYNW mass index is 39.62 kg/(m^2).     Last Height:   Ht  Readings from Last 1 Encounters:  01/01/15  (1.702 m)    Physical exam:  General: The patient is awake, alert and appears not in acute distress. The patient is well groomed. Head: Normocephalic, atraumatic. Neck is supple. Mallampati 4 ,  neck circumference:18. Nasal airflow restricted , TMJ is  Not  evident . Retrognathia is not seen.  Cardiovascular:  Regular rate and rhythm , without  murmurs or carotid bruit, and without distended neck veins. Respiratory: Lungs are clear to auscultation. Skin:  Without evidence of edema, facial erythema noted.  Trunk: BMI is elevated . The patient's posture is erect.  Neurologic exam : The patient is awake and alert, oriented to place and time.   Memory subjective  described as intact.  Attention span & concentration ability appears normal.  Speech is fluent,  without  dysarthria, dysphonia or aphasia.  Mood and affect are appropriate.  Cranial nerves: Pupils are equal and briskly reactive to light. Funduscopic exam without   evidence of pallor or edema.  Extraocular movements  in vertical and horizontal planes intact and without nystagmus. Visual fields by finger perimetry are intact. Hearing to finger rub intact.   Facial sensation intact to fine touch.  Facial motor strength is symmetric and tongue and uvula move midline. Shoulder shrug was symmetrical.   Motor exam:   Normal tone, muscle bulk and symmetric strength in all extremities.  Sensory:  Fine touch, pinprick and vibration were tested in all extremities. Proprioception tested in the upper extremities was normal. He feels his feet getting numb.   Coordination: Rapid alternating movements in the fingers/hands was normal.  Finger-to-nose maneuver normal without evidence of ataxia, dysmetria or tremor. He has noted tremors when nervous, when self conscious and when a upset.   Gait and station: Patient walks without assistive device and is able unassisted to climb up to the exam  table. Strength within normal limits.  Stance is stable and normal.   Deep tendon reflexes: in the  upper and lower extremities are symmetric and intact. Babinski maneuver response is downgoing.  The patient was advised of the nature of the diagnosed sleep disorder , the treatment options and risks for general a health and wellness arising from not treating the condition.  I spent more than 40 minutes of face to face time with the patient. Greater than 50% of time was spent in counseling and coordination of care. We have discussed the diagnosis and differential and I answered the patient's questions.     Assessment:  After physical and neurologic examination,  review of laboratory studies,  Personal review of imaging studies, reports of other /same  Imaging studies ,  Results of neurophysiology testing and pre-existing records as far as provided in visit., my assessment is   1) Mr. [place] has an elevated body mass index, high-grade Mallampati and a large neck circumference. All these are risk factors for obstructive sleep apnea and he is well aware of that. He has been told that he snores and that his breath is  irregular.  2) he has comorbidities of obesity, hypertension and reports frequent headaches - also they're not as severe as they used to be a while back. His headaches have never woken him from sleep but they our presence at times when he wakes up. These could be due to hypoventilation, ETOH use, and he has a long-standing history of nicotine use quit only 2 years ago. He is not known to be diagnosed with asthma or emphysema.  3) he has anxiety, depression.     Plan:  Treatment plan and additional workup :     Melvyn Novasarmen Iisha Soyars MD  01/01/2015   CC: Collene GobbleSteven A Daub, Md [address] [place], [place] [id number]

## 2015-01-04 ENCOUNTER — Telehealth: Payer: Self-pay | Admitting: Neurology

## 2015-01-04 NOTE — Telephone Encounter (Signed)
I called the pharmacy.  The patient's ins will only cover #30 tabs per fill instead of #60.  They will fill Rx for 30 at a time so ins will pay.  They will notify patient when Rx is ready for pick up.  We called patient back to advise.  Got no answer.  Left message.

## 2015-01-04 NOTE — Telephone Encounter (Signed)
Pt called sts he needs new RX for PARoxetine (PAXIL) 10 MG tablet as insurance will only pay for # 30.

## 2015-02-01 ENCOUNTER — Encounter: Payer: Self-pay | Admitting: Emergency Medicine

## 2015-02-01 ENCOUNTER — Ambulatory Visit (INDEPENDENT_AMBULATORY_CARE_PROVIDER_SITE_OTHER): Payer: PRIVATE HEALTH INSURANCE | Admitting: Emergency Medicine

## 2015-02-01 VITALS — BP 124/86 | HR 69 | Temp 98.2°F | Resp 16 | Ht 67.5 in | Wt 250.2 lb

## 2015-02-01 DIAGNOSIS — G4710 Hypersomnia, unspecified: Secondary | ICD-10-CM

## 2015-02-01 DIAGNOSIS — Z23 Encounter for immunization: Secondary | ICD-10-CM | POA: Diagnosis not present

## 2015-02-01 DIAGNOSIS — G479 Sleep disorder, unspecified: Secondary | ICD-10-CM

## 2015-02-01 DIAGNOSIS — E1165 Type 2 diabetes mellitus with hyperglycemia: Secondary | ICD-10-CM

## 2015-02-01 DIAGNOSIS — I10 Essential (primary) hypertension: Secondary | ICD-10-CM | POA: Diagnosis not present

## 2015-02-01 DIAGNOSIS — R0683 Snoring: Secondary | ICD-10-CM | POA: Diagnosis not present

## 2015-02-01 DIAGNOSIS — G4726 Circadian rhythm sleep disorder, shift work type: Secondary | ICD-10-CM

## 2015-02-01 DIAGNOSIS — F4321 Adjustment disorder with depressed mood: Secondary | ICD-10-CM | POA: Diagnosis not present

## 2015-02-01 DIAGNOSIS — IMO0002 Reserved for concepts with insufficient information to code with codable children: Secondary | ICD-10-CM

## 2015-02-01 DIAGNOSIS — G4730 Sleep apnea, unspecified: Secondary | ICD-10-CM | POA: Diagnosis not present

## 2015-02-01 DIAGNOSIS — E118 Type 2 diabetes mellitus with unspecified complications: Secondary | ICD-10-CM

## 2015-02-01 LAB — LIPID PANEL
Cholesterol: 185 mg/dL (ref 125–200)
HDL: 42 mg/dL (ref >=40)
LDL CALC: 114 mg/dL (ref <130)
Total CHOL/HDL Ratio: 4.4 Ratio (ref <=5.0)
Triglycerides: 143 mg/dL (ref <150)
VLDL: 29 mg/dL (ref <30)

## 2015-02-01 LAB — POCT GLYCOSYLATED HEMOGLOBIN (HGB A1C): Hemoglobin A1C: 7.7

## 2015-02-01 LAB — GLUCOSE, POCT (MANUAL RESULT ENTRY): POC GLUCOSE: 117 mg/dL — AB (ref 70–99)

## 2015-02-01 MED ORDER — METFORMIN HCL 500 MG PO TABS: ORAL_TABLET | ORAL | Status: DC

## 2015-02-01 MED ORDER — DILTIAZEM HCL ER COATED BEADS 240 MG PO CP24: 240.0000 mg | ORAL_CAPSULE | Freq: Every day | ORAL | Status: DC

## 2015-02-01 MED ORDER — PAROXETINE HCL 10 MG PO TABS: 10.0000 mg | ORAL_TABLET | Freq: Every day | ORAL | Status: DC

## 2015-02-01 NOTE — Progress Notes (Signed)
Patient ID: Clifford Mason, male   DOB: [birth year], 50 y.o.   MRN: [id number]     By signing my name below, I, Littie Deeds, attest that this documentation has been prepared under the direction and in the presence of [name], MD.  Electronically Signed: Littie Deeds, Medical Scribe. 02/01/2015. 12:12 PM.   Chief Complaint:  Chief Complaint  Patient presents with  . Follow-up    blood pressure  . Medication Refill    metformin, cardizem and paxil    HPI: Clifford Mason is a 50 y.o. male with a history of DM, HTN, and hypersomnia with sleep apnea who reports to [facility] today for a follow-up. He is fasting today; he last ate yesterday around 11:30. Patient notes he has been walking every day and has been trying to improve his diet. He has mostly cut out sweets from his diet. He states he has been compliant with the metformin. Patient denies chest pain and SOB. He quit smoking 2 years ago. He does drink alcohol but denies heavy alcohol use. He agrees to have the flu shot today.  Patient did see Dr. Vickey Huger which went well; he has not yet set up the sleep study. He states that he missed his appointment for the CT head because he had forgotten.  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 He usually sleeps from 3:00 PM until 9:30/10:00 PM.   Past Medical History  Diagnosis Date  . Heart murmur   . Hypertension   . Diabetes mellitus without complication (HCC)    No past surgical history on file. Social History   Social History  . Marital Status: Single    Spouse Name: N/A  . Number of Children: N/A  . Years of Education: N/A   Occupational History  . Hotel    Social History Main Topics  . Smoking status: Never Smoker   . Smokeless tobacco: None  . Alcohol Use: 0.0 oz/week    0 Standard drinks or equivalent per week  . Drug Use: None  . Sexual Activity: Not Asked   Other Topics Concern  . None   Social History Narrative   Works third  shift at [employer].      Drinks occasional soda 1-2 week.   Family History  Problem Relation Age of Onset  . Healthy Mother    Not on File Prior to Admission medications   Medication Sig Start Date End Date Taking? Authorizing Provider  diltiazem (CARDIZEM CD) 240 MG 24 hr capsule Take 1 capsule (240 mg total) by mouth daily. 12/08/14  Yes Collene Gobble, MD  metFORMIN (GLUCOPHAGE) 500 MG tablet Take 1 tablet daily for the first week then 1 twice a day. 12/08/14  Yes Collene Gobble, MD  PARoxetine (PAXIL) 10 MG tablet Take 1 tablet (10 mg total) by mouth daily. 01/01/15  Yes Carmen Dohmeier, MD     ROS: The patient denies fevers, chills, night sweats, unintentional weight loss, chest pain, palpitations, wheezing, dyspnea on exertion, nausea, vomiting, abdominal pain, dysuria, hematuria, melena, numbness, weakness, or tingling.   All other systems have been reviewed and were otherwise negative with the exception of those mentioned in the HPI and as above.    PHYSICAL EXAM: Filed Vitals:   02/01/15 1152 02/01/15 1207  BP: 152/101 124/86  Pulse: 69   Temp: 98.2 F (36.8 C)   Resp: 16    Body mass index is 38.59 kg/(m^2).   General: Alert.  Ruddy complexion. No distress. HEENT:  Normocephalic, atraumatic, oropharynx patent. [facility]OMI[facility] Cardiovascular:  Regular rate and rhythm, no rubs murmurs or gallops.  No Carotid bruits, radial pulse intact. No pedal edema. Repeat blood pressure: 140/90. Respiratory: Clear to auscultation bilaterally.  No wheezes, rales, or rhonchi.  No cyanosis, no use of accessory musculature Abdominal: Obese with abdominal striae. Musculoskeletal: Gait intact. Flat-footed.  Skin: No rashes. Neurologic: Facial musculature symmetric. Psychiatric: Patient acts appropriately throughout our interaction. Lymphatic: No cervical or submandibular lymphadenopathy      LABS: Results for orders placed or performed in visit on 02/01/15  POCT glucose (manual  entry)  Result Value Ref Range   POC Glucose 117 (A) 70 - 99 mg/dl  POCT glycosylated hemoglobin (Hb A1C)  Result Value Ref Range   Hemoglobin A1C 7.7      EKG/XRAY:   [facility]   ASSESSMENT/PLAN: Patient doing much better. Have added Lipitor 20 one at night. No change in other medications. Recheck 3-4 months.I personally performed the services described in this documentation, which was scribed in my presence. The recorded information has been reviewed and is accurate. Flu shot given    Gross sideeffects, risk and benefits, and alternatives of medications d/w patient. Patient is aware that all medications have potential sideeffects and we are unable to predict every sideeffect or drug-drug interaction that may occur.  Lesle ChrisSteven Daub MD 02/01/2015 12:12 PM

## 2015-04-28 ENCOUNTER — Other Ambulatory Visit: Payer: Self-pay | Admitting: Emergency Medicine

## 2015-05-03 ENCOUNTER — Ambulatory Visit (INDEPENDENT_AMBULATORY_CARE_PROVIDER_SITE_OTHER): Payer: PRIVATE HEALTH INSURANCE | Admitting: Emergency Medicine

## 2015-05-03 ENCOUNTER — Encounter: Payer: Self-pay | Admitting: Emergency Medicine

## 2015-05-03 VITALS — BP 157/99 | HR 72 | Temp 98.4°F | Resp 16 | Ht 68.0 in | Wt 257.0 lb

## 2015-05-03 DIAGNOSIS — E118 Type 2 diabetes mellitus with unspecified complications: Secondary | ICD-10-CM

## 2015-05-03 DIAGNOSIS — E1165 Type 2 diabetes mellitus with hyperglycemia: Secondary | ICD-10-CM | POA: Diagnosis not present

## 2015-05-03 DIAGNOSIS — G479 Sleep disorder, unspecified: Secondary | ICD-10-CM | POA: Diagnosis not present

## 2015-05-03 DIAGNOSIS — IMO0002 Reserved for concepts with insufficient information to code with codable children: Secondary | ICD-10-CM

## 2015-05-03 DIAGNOSIS — I10 Essential (primary) hypertension: Secondary | ICD-10-CM

## 2015-05-03 LAB — GLUCOSE, POCT (MANUAL RESULT ENTRY): POC Glucose: 132 mg/dl — AB (ref 70–99)

## 2015-05-03 LAB — POCT GLYCOSYLATED HEMOGLOBIN (HGB A1C): Hemoglobin A1C: 7

## 2015-05-03 MED ORDER — LOSARTAN POTASSIUM 50 MG PO TABS: 50.0000 mg | ORAL_TABLET | Freq: Every day | ORAL | Status: DC

## 2015-05-03 NOTE — Progress Notes (Addendum)
Patient ID: Clifford Mason, male   DOB: 01/28/1965, 50 y.o.   MRN: [id number]    By signing my name below, I, Clifford Mason, attest that this documentation has been prepared under the direction and in the presence of Collene GobbleSteven A Montserrath Madding, MD Electronically Signed: Charline BillsEssence Mason, ED Scribe 05/03/2015 at 10:30 AM.  Chief Complaint:  Chief Complaint  Patient presents with  . Follow-up  . Blood Sugar Problem   HPI: Clifford Mason is a 50 y.o. male, with a h/o HTN and DM, who [facility] regarding DM. Pt states that he has never checked his blood glucose regularly. He states that he still walks daily for about an hour but has eaten fast food daily for the past month. Pt is compliant with all medications. He denies numbness/tingling in his lower extremities. He states that he went for a sleep consultation but does not believe he can afford to actually have the sleep study done.   Wt Readings from Last 3 Encounters:  05/03/15 257 lb (116.574 kg)  02/01/15 250 lb 3.2 oz (113.49 kg)  01/01/15 253 lb (114.76 kg)   Lab Results  Component Value Date   HGBA1C 7.7 02/01/2015   Past Medical History  Diagnosis Date  . Heart murmur   . Hypertension   . Diabetes mellitus without complication (HCC)    No past surgical history on file. Social History   Social History  . Marital Status: Single    Spouse Name: N/A  . Number of Children: N/A  . Years of Education: N/A   Occupational History  . Hotel    Social History Main Topics  . Smoking status: Never Smoker   . Smokeless tobacco: None  . Alcohol Use: 0.0 oz/week    0 Standard drinks or equivalent per week  . Drug Use: None  . Sexual Activity: Not Asked   Other Topics Concern  . None   Social History Narrative   Works third shift at [employer].      Drinks occasional soda 1-2 week.   Family History  Problem Relation Age of Onset  . Healthy Mother    No Known Allergies Prior to Admission medications     Medication Sig Start Date End Date Taking? Authorizing Provider  diltiazem (CARDIZEM CD) 240 MG 24 hr capsule Take 1 capsule (240 mg total) by mouth daily. 02/01/15  Yes Collene GobbleSteven A Anaiza Behrens, MD  metFORMIN (GLUCOPHAGE) 500 MG tablet Take 1 tablet daily for the first week then 1 twice a day. 02/01/15  Yes Collene GobbleSteven A Kayliee Atienza, MD  PARoxetine (PAXIL) 10 MG tablet TAKE 2 TABLETS BY MOUTH DAILY 04/29/15  Yes Collene GobbleSteven A Thaddeaus Monica, MD   ROS: The patient denies fevers, chills, night sweats, unintentional weight loss, chest pain, palpitations, wheezing, dyspnea on exertion, nausea, vomiting, abdominal pain, dysuria, hematuria, melena, numbness, weakness, or tingling.   All other systems have been reviewed and were otherwise negative with the exception of those mentioned in the HPI and as above.    PHYSICAL EXAM: Filed Vitals:   05/03/15 1002 05/03/15 1003  BP: 161/98 157/99  Pulse: 76 72  Temp: 98.4 F (36.9 C)   Resp: 16    Body mass index is 39.09 kg/(m^2).  General: Alert, no acute distress HEENT:  Normocephalic, atraumatic, oropharynx patent. [facility] Cardiovascular: Regular rate and rhythm, no rubs murmurs or gallops. No Carotid bruits, radial pulse intact. No pedal edema.  Respiratory: Clear to auscultation bilaterally. No wheezes, rales, or  rhonchi. No cyanosis, no use of accessory musculature Abdominal: Morbidly obese abdomen with striae laterally.  Non-tender, positive bowel sounds. No masses. Musculoskeletal: Gait intact. No edema, tenderness Skin: No rashes. Neurologic: Facial musculature symmetric. Psychiatric: Patient acts appropriately throughout our interaction. Lymphatic: No cervical or submandibular lymphadenopathy  LABS:  EKG/XRAY:   Primary read interpreted by Dr. Cleta Alberts at [facility].  ASSESSMENT/PLAN: A1c has dropped from 7.727. He has not worked very hard on diet. He has not had his sleep study for financial reasons. He is going to focus more on his diet. He does walk for 1 hour a day.  His weight is up. No changes in medication except I did add losartan 50 mg to help with kidney protection. He also was told he needed to take a baby aspirin one a day.I personally performed the services described in this documentation, which was scribed in my presence. The recorded information has been reviewed and is accurate.    Gross sideeffects, risk and benefits, and alternatives of medications d/w patient. Patient is aware that all medications have potential sideeffects and we are unable to predict every sideeffect or drug-drug interaction that may occur.  [name] MD 05/03/2015 10:22 AM

## 2015-05-03 NOTE — Patient Instructions (Addendum)
   IF you received an x-ray today, you will receive an invoice from [facility]. Please contact [facility] at [phone] with questions or concerns regarding your invoice.   IF you received labwork today, you will receive an invoice from [facility]. Please contact [facility] at [phone] with questions or concerns regarding your invoice.   Our billing staff will not be able to assist you with questions regarding bills from these companies.  You will be contacted with the lab results as soon as they are available. The fastest way to get your results is to activate your My Chart account. Instructions are located on the last page of this paperwork. If you have not heard from us regarding the results in 2 weeks, please contact this office.     Diabetes Mellitus and Food It is important for you to manage your blood sugar (glucose) level. Your blood glucose level can be greatly affected by what you eat. Eating healthier foods in the appropriate amounts throughout the day at about the same time each day will help you control your blood glucose level. It can also help slow or prevent worsening of your diabetes mellitus. Healthy eating may even help you improve the level of your blood pressure and reach or maintain a healthy weight.  General recommendations for healthful eating and cooking habits include:  Eating meals and snacks regularly. Avoid going long periods of time without eating to lose weight.  Eating a diet that consists mainly of plant-based foods, such as fruits, vegetables, nuts, legumes, and whole grains.  Using low-heat cooking methods, such as baking, instead of high-heat cooking methods, such as deep frying. Work with your dietitian to make sure you understand how to use the Nutrition Facts information on food labels. HOW CAN FOOD AFFECT ME? Carbohydrates Carbohydrates affect your blood glucose level more than any other type of  food. Your dietitian will help you determine how many carbohydrates to eat at each meal and teach you how to count carbohydrates. Counting carbohydrates is important to keep your blood glucose at a healthy level, especially if you are using insulin or taking certain medicines for diabetes mellitus. Alcohol Alcohol can cause sudden decreases in blood glucose (hypoglycemia), especially if you use insulin or take certain medicines for diabetes mellitus. Hypoglycemia can be a life-threatening condition. Symptoms of hypoglycemia (sleepiness, dizziness, and disorientation) are similar to symptoms of having too much alcohol.  If your health care provider has given you approval to drink alcohol, do so in moderation and use the following guidelines:  Women should not have more than one drink per day, and men should not have more than two drinks per day. One drink is equal to:  12 oz of beer.  5 oz of wine.  1 oz of hard liquor.  Do not drink on an empty stomach.  Keep yourself hydrated. Have water, diet soda, or unsweetened iced tea.  Regular soda, juice, and other mixers might contain a lot of carbohydrates and should be counted. WHAT FOODS ARE NOT RECOMMENDED? As you make food choices, it is important to remember that all foods are not the same. Some foods have fewer nutrients per serving than other foods, even though they might have the same number of calories or carbohydrates. It is difficult to get your body what it needs when you eat foods with fewer nutrients. Examples of foods that you should avoid that are high in calories and carbohydrates but low in nutrients include:  Trans   fats (most processed foods list trans fats on the Nutrition Facts label).  Regular soda.  Juice.  Candy.  Sweets, such as cake, pie, doughnuts, and cookies.  Fried foods. WHAT FOODS CAN I EAT? Eat nutrient-rich foods, which will nourish your body and keep you healthy. The food you should eat also will depend  on several factors, including:  The calories you need.  The medicines you take.  Your weight.  Your blood glucose level.  Your blood pressure level.  Your cholesterol level. You should eat a variety of foods, including:  Protein.  Lean cuts of meat.  Proteins low in saturated fats, such as fish, egg whites, and beans. Avoid processed meats.  Fruits and vegetables.  Fruits and vegetables that may help control blood glucose levels, such as apples, mangoes, and yams.  Dairy products.  Choose fat-free or low-fat dairy products, such as milk, yogurt, and cheese.  Grains, bread, pasta, and rice.  Choose whole grain products, such as multigrain bread, whole oats, and brown rice. These foods may help control blood pressure.  Fats.  Foods containing healthful fats, such as nuts, avocado, olive oil, canola oil, and fish. DOES EVERYONE WITH DIABETES MELLITUS HAVE THE SAME MEAL PLAN? Because every person with diabetes mellitus is different, there is not one meal plan that works for everyone. It is very important that you meet with a dietitian who will help you create a meal plan that is just right for you.   This information is not intended to replace advice given to you by your health care provider. Make sure you discuss any questions you have with your health care provider.   Document Released: 10/03/2004 Document Revised: 01/27/2014 Document Reviewed: 12/03/2012 Elsevier Interactive Patient Education 2016 [employer]  

## 2015-09-11 ENCOUNTER — Encounter: Payer: Self-pay | Admitting: Emergency Medicine

## 2015-09-11 LAB — HM DIABETES EYE EXAM

## 2016-02-01 ENCOUNTER — Other Ambulatory Visit: Payer: Self-pay | Admitting: Emergency Medicine

## 2016-03-10 ENCOUNTER — Other Ambulatory Visit: Payer: Self-pay | Admitting: Emergency Medicine

## 2016-04-09 ENCOUNTER — Other Ambulatory Visit: Payer: Self-pay | Admitting: Emergency Medicine

## 2016-04-17 ENCOUNTER — Other Ambulatory Visit: Payer: Self-pay | Admitting: Physician Assistant

## 2016-04-17 NOTE — Telephone Encounter (Signed)
Please call and schedule an appt with his new PCP.

## 2016-05-18 ENCOUNTER — Other Ambulatory Visit: Payer: Self-pay | Admitting: Urgent Care

## 2016-06-11 ENCOUNTER — Other Ambulatory Visit: Payer: Self-pay | Admitting: Family Medicine

## 2016-06-12 NOTE — Telephone Encounter (Signed)
Call --- I denied his refill for Metformin as it has been over one year since he was last seen.  Dr. Cleta Albertsaub has retired; please schedule an appointment with a NEW provider in the upcoming week.

## 2016-06-17 ENCOUNTER — Telehealth: Payer: Self-pay | Admitting: Family Medicine

## 2016-06-17 NOTE — Telephone Encounter (Signed)
LMOM TO CALL AND SCHEDULE AN OV WITH ONE OF OUR PROVIDERS SINCE DR DAUB HAS RETIRED PT NED A REFILL ON HIS METFORMIN DR Katrinka BlazingSMITH DENIED IT CAUSE HE HASN'T BEEN HERE FOR OVER A YEAR

## 2016-10-21 ENCOUNTER — Ambulatory Visit (INDEPENDENT_AMBULATORY_CARE_PROVIDER_SITE_OTHER): Payer: PRIVATE HEALTH INSURANCE | Admitting: Emergency Medicine

## 2016-10-21 ENCOUNTER — Encounter: Payer: Self-pay | Admitting: Emergency Medicine

## 2016-10-21 VITALS — BP 150/100 | HR 99 | Temp 98.0°F | Resp 16 | Ht 67.25 in | Wt 260.8 lb

## 2016-10-21 DIAGNOSIS — E119 Type 2 diabetes mellitus without complications: Secondary | ICD-10-CM | POA: Diagnosis not present

## 2016-10-21 DIAGNOSIS — I10 Essential (primary) hypertension: Secondary | ICD-10-CM | POA: Diagnosis not present

## 2016-10-21 DIAGNOSIS — F411 Generalized anxiety disorder: Secondary | ICD-10-CM | POA: Diagnosis not present

## 2016-10-21 DIAGNOSIS — Z23 Encounter for immunization: Secondary | ICD-10-CM

## 2016-10-21 MED ORDER — DILTIAZEM HCL ER COATED BEADS 240 MG PO CP24: 240.0000 mg | ORAL_CAPSULE | Freq: Every day | ORAL | 3 refills | Status: DC

## 2016-10-21 MED ORDER — LOSARTAN POTASSIUM 50 MG PO TABS: 50.0000 mg | ORAL_TABLET | Freq: Every day | ORAL | 3 refills | Status: DC

## 2016-10-21 MED ORDER — METFORMIN HCL 500 MG PO TABS: 500.0000 mg | ORAL_TABLET | Freq: Two times a day (BID) | ORAL | 3 refills | Status: DC

## 2016-10-21 MED ORDER — SERTRALINE HCL 50 MG PO TABS: 50.0000 mg | ORAL_TABLET | Freq: Every day | ORAL | 3 refills | Status: DC

## 2016-10-21 NOTE — Progress Notes (Signed)
Clifford Mason 51 y.o.   Chief Complaint  Patient presents with  . Medication Refill    ALL RXs-per patient he has been without medication x 6 months    HISTORY OF PRESENT ILLNESS: This is a 51 y.o. male with h/o DM and HTN off medications for at least 6 months; not physically active and not eating well. Has no complaints; states Paxil was prescribed for GAD but wasn't working and also affecting his erection.  HPI   Prior to Admission medications   Medication Sig Start Date End Date Taking? Authorizing Provider  diltiazem (CARDIZEM CD) 240 MG 24 hr capsule Take 1 capsule (240 mg total) by mouth daily. Office visit needed for additional refills. 1st notice. Patient not taking: Reported on 10/21/2016 04/09/16   Wallis Bamberg, PA-C  losartan (COZAAR) 50 MG tablet TAKE 1 TABLET BY MOUTH DAILY. Patient not taking: Reported on 10/21/2016 04/17/16   Morrell Riddle, PA-C  metFORMIN (GLUCOPHAGE) 500 MG tablet Take 1 tablet (500 mg total) by mouth 2 (two) times daily with a meal. *office visit needed for refills* Patient not taking: Reported on 10/21/2016 05/19/16   Ethelda Chick, MD  PARoxetine (PAXIL) 10 MG tablet TAKE 2 TABLETS BY MOUTH DAILY Patient not taking: Reported on 10/21/2016 04/29/15   Collene Gobble, MD    No Known Allergies  Patient Active Problem List   Diagnosis Date Noted  . Morbid obesity due to excess calories (HCC) 01/01/2015  . Adjustment disorder with depressed mood 01/01/2015  . Hypersomnia with sleep apnea 01/01/2015  . Snoring 01/01/2015  . Sleep disorder, circadian, shift work type 01/01/2015  . Diabetes (HCC) 12/08/2014  . Essential hypertension 12/08/2014    Past Medical History:  Diagnosis Date  . Diabetes mellitus without complication (HCC)   . Heart murmur   . Hypertension     No past surgical history on file.  Social History   Social History  . Marital status: Single    Spouse name: N/A  . Number of children: N/A  . Years of education: N/A    Occupational History  . Hotel    Social History Main Topics  . Smoking status: Never Smoker  . Smokeless tobacco: Never Used  . Alcohol use 0.0 oz/week  . Drug use: No  . Sexual activity: Not on file   Other Topics Concern  . Not on file   Social History Narrative   Works third shift at a hotel.      Drinks occasional soda 1-2 week.    Family History  Problem Relation Age of Onset  . Healthy Mother      Review of Systems  Constitutional: Negative.  Negative for chills, diaphoresis, fever and malaise/fatigue.  HENT: Negative.  Negative for congestion, nosebleeds and sore throat.   Eyes: Negative.  Negative for blurred vision, double vision, discharge and redness.  Respiratory: Negative.  Negative for cough, hemoptysis and shortness of breath.   Cardiovascular: Negative.  Negative for chest pain, palpitations, claudication and leg swelling.  Gastrointestinal: Negative.  Negative for abdominal pain, blood in stool, diarrhea, nausea and vomiting.  Genitourinary: Negative.  Negative for dysuria, flank pain and hematuria.  Musculoskeletal: Negative for back pain, myalgias and neck pain.  Skin: Negative.  Negative for rash.  Neurological: Negative.  Negative for dizziness, sensory change, focal weakness and headaches.  Endo/Heme/Allergies: Negative.   Psychiatric/Behavioral: The patient is nervous/anxious.   All other systems reviewed and are negative.  Vitals:   10/21/16 4098 10/21/16 0935  BP: 116/76 (!) 150/100  Pulse: 99   Resp: 16   Temp: 98 F (36.7 C)   SpO2: 97%     Physical Exam  Constitutional: He is oriented to person, place, and time. He appears well-developed and well-nourished.  Obese.  HENT:  Head: Normocephalic and atraumatic.  Right Ear: External ear normal.  Left Ear: External ear normal.  Nose: Nose normal.  Mouth/Throat: Oropharynx is clear and moist.  Eyes: Pupils are equal, round, and reactive to light. Conjunctivae and EOM are normal.   Neck: Normal range of motion. Neck supple. No JVD present.  Cardiovascular: Normal rate, regular rhythm, normal heart sounds and intact distal pulses.   Pulmonary/Chest: Effort normal and breath sounds normal.  Abdominal: Soft. Bowel sounds are normal. He exhibits no distension. There is no tenderness.  Musculoskeletal: Normal range of motion.  Lymphadenopathy:    He has no cervical adenopathy.  Neurological: He is alert and oriented to person, place, and time. No sensory deficit. He exhibits normal muscle tone.  Skin: Skin is warm and dry. Capillary refill takes less than 2 seconds. No rash noted.  Psychiatric: He has a normal mood and affect. His behavior is normal.  Vitals reviewed.    ASSESSMENT & PLAN: A total of 25 minutes was spent in the room with the patient, greater than 50% of which was in counseling/coordination of care regarding DM, HTN, and GAD.  [name] was seen today for medication refill.  Diagnoses and all orders for this visit:  Essential hypertension -     CBC with Differential/Platelet -     Comprehensive metabolic panel  Need for prophylactic vaccination and inoculation against influenza -     Flu Vaccine QUAD 36+ mos IM  Type 2 diabetes mellitus without complication, without long-term current use of insulin (HCC) -     Comprehensive metabolic panel -     Hemoglobin A1c  GAD (generalized anxiety disorder)  Other orders -     Cancel: Tdap vaccine greater than or equal to 7yo IM -     diltiazem (CARDIZEM CD) 240 MG 24 hr capsule; Take 1 capsule (240 mg total) by mouth daily. -     losartan (COZAAR) 50 MG tablet; Take 1 tablet (50 mg total) by mouth daily. -     metFORMIN (GLUCOPHAGE) 500 MG tablet; Take 1 tablet (500 mg total) by mouth 2 (two) times daily with a meal. -     sertraline (ZOLOFT) 50 MG tablet; Take 1 tablet (50 mg total) by mouth daily.    Patient Instructions       IF you received an x-ray today, you will receive an invoice  from [facility]. Please contact [facility] at [phone] with questions or concerns regarding your invoice.   IF you received labwork today, you will receive an invoice from [place]. Please contact [facility] at [phone] with questions or concerns regarding your invoice.   Our billing staff will not be able to assist you with questions regarding bills from these companies.  You will be contacted with the lab results as soon as they are available. The fastest way to get your results is to activate your My Chart account. Instructions are located on the last page of this paperwork. If you have not heard from [place] regarding the results in 2 weeks, please contact this office.     How to Take Your Blood Pressure You can take your blood pressure at home with a machine. You may need to check  your blood pressure at home:  To check if you have high blood pressure (hypertension).  To check your blood pressure over time.  To make sure your blood pressure medicine is working.  Supplies needed: You will need a blood pressure machine, or monitor. You can buy one at a drugstore or online. When choosing one:  Choose one with an arm cuff.  Choose one that wraps around your upper arm. Only one finger should fit between your arm and the cuff.  Do not choose one that measures your blood pressure from your wrist or finger.  Your doctor can suggest a monitor. How to prepare Avoid these things for 30 minutes before checking your blood pressure:  Drinking caffeine.  Drinking alcohol.  Eating.  Smoking.  Exercising.  Five minutes before checking your blood pressure:  Pee.  Sit in a dining chair. Avoid sitting in a soft couch or armchair.  Be quiet. Do not talk.  How to take your blood pressure Follow the instructions that came with your machine. If you have a digital blood pressure monitor, these may be the instructions: 1. Sit up straight. 2. Place your feet on the  floor. Do not cross your ankles or legs. 3. Rest your left arm at the level of your heart. You may rest it on a table, desk, or chair. 4. Pull up your shirt sleeve. 5. Wrap the blood pressure cuff around the upper part of your left arm. The cuff should be 1 inch (2.5 cm) above your elbow. It is best to wrap the cuff around bare skin. 6. Fit the cuff snugly around your arm. You should be able to place only one finger between the cuff and your arm. 7. Put the cord inside the groove of your elbow. 8. Press the power button. 9. Sit quietly while the cuff fills with air and loses air. 10. Write down the numbers on the screen. 11. Wait 2-3 minutes and then repeat steps 1-10.  What do the numbers mean? Two numbers make up your blood pressure. The first number is called systolic pressure. The second is called diastolic pressure. An example of a blood pressure reading is "120 over 80" (or 120/80). If you are an adult and do not have a medical condition, use this guide to find out if your blood pressure is normal: Normal  First number: below 120.  Second number: below 80. Elevated  First number: 120-129.  Second number: below 80. Hypertension stage 1  First number: 130-139.  Second number: 80-89. Hypertension stage 2  First number: 140 or above.  Second number: 90 or above. Your blood pressure is above normal even if only the top or bottom number is above normal. Follow these instructions at home:  Check your blood pressure as often as your doctor tells you to.  Take your monitor to your next doctor's appointment. Your doctor will: ? Make sure you are using it correctly. ? Make sure it is working right.  Make sure you understand what your blood pressure numbers should be.  Tell your doctor if your medicines are causing side effects. Contact a doctor if:  Your blood pressure keeps being high. Get help right away if:  Your first blood pressure number is higher than 180.  Your  second blood pressure number is higher than 120. This information is not intended to replace advice given to you by your health care provider. Make sure you discuss any questions you have with your health care provider. Document  Released: 12/20/2007 Document Revised: 12/05/2015 Document Reviewed: 06/15/2015 Elsevier Interactive Patient Education  2018 [employer].  Hypertension Hypertension is another name for high blood pressure. High blood pressure forces your heart to work harder to pump blood. This can cause problems over time. There are two numbers in a blood pressure reading. There is a top number (systolic) over a bottom number (diastolic). It is best to have a blood pressure below 120/80. Healthy choices can help lower your blood pressure. You may need medicine to help lower your blood pressure if:  Your blood pressure cannot be lowered with healthy choices.  Your blood pressure is higher than 130/80.  Follow these instructions at home: Eating and drinking  If directed, follow the DASH eating plan. This diet includes: ? Filling half of your plate at each meal with fruits and vegetables. ? Filling one quarter of your plate at each meal with whole grains. Whole grains include whole wheat pasta, brown rice, and whole grain bread. ? Eating or drinking low-fat dairy products, such as skim milk or low-fat yogurt. ? Filling one quarter of your plate at each meal with low-fat (lean) proteins. Low-fat proteins include fish, skinless chicken, eggs, beans, and tofu. ? Avoiding fatty meat, cured and processed meat, or chicken with skin. ? Avoiding premade or processed food.  Eat less than 1,500 mg of salt (sodium) a day.  Limit alcohol use to no more than 1 drink a day for nonpregnant women and 2 drinks a day for men. One drink equals 12 oz of beer, 5 oz of wine, or 1 oz of hard liquor. Lifestyle  Work with your doctor to stay at a healthy weight or to lose weight. Ask your doctor what  the best weight is for you.  Get at least 30 minutes of exercise that causes your heart to beat faster (aerobic exercise) most days of the week. This may include walking, swimming, or biking.  Get at least 30 minutes of exercise that strengthens your muscles (resistance exercise) at least 3 days a week. This may include lifting weights or pilates.  Do not use any products that contain nicotine or tobacco. This includes cigarettes and e-cigarettes. If you need help quitting, ask your doctor.  Check your blood pressure at home as told by your doctor.  Keep all follow-up visits as told by your doctor. This is important. Medicines  Take over-the-counter and prescription medicines only as told by your doctor. Follow directions carefully.  Do not skip doses of blood pressure medicine. The medicine does not work as well if you skip doses. Skipping doses also puts you at risk for problems.  Ask your doctor about side effects or reactions to medicines that you should watch for. Contact a doctor if:  You think you are having a reaction to the medicine you are taking.  You have headaches that keep coming back (recurring).  You feel dizzy.  You have swelling in your ankles.  You have trouble with your vision. Get help right away if:  You get a very bad headache.  You start to feel confused.  You feel weak or numb.  You feel faint.  You get very bad pain in your: ? Chest. ? Belly (abdomen).  You throw up (vomit) more than once.  You have trouble breathing. Summary  Hypertension is another name for high blood pressure.  Making healthy choices can help lower blood pressure. If your blood pressure cannot be controlled with healthy choices, you may need to  take medicine. This information is not intended to replace advice given to you by your health care provider. Make sure you discuss any questions you have with your health care provider. Document Released: 06/25/2007 Document  Revised: 12/05/2015 Document Reviewed: 12/05/2015 Elsevier Interactive Patient Education  2018 Elsevier Inc.      Edwina Barth, MD [facility]

## 2016-10-21 NOTE — Patient Instructions (Addendum)
   IF you received an x-ray today, you will receive an invoice from [facility]. Please contact  Radiology at [phone] with questions or concerns regarding your invoice.   IF you received labwork today, you will receive an invoice from [facility]. Please contact [facility] at [phone] with questions or concerns regarding your invoice.   Our billing staff will not be able to assist you with questions regarding bills from these companies.  You will be contacted with the lab results as soon as they are available. The fastest way to get your results is to activate your My Chart account. Instructions are located on the last page of this paperwork. If you have not heard from us regarding the results in 2 weeks, please contact this office.     How to Take Your Blood Pressure You can take your blood pressure at home with a machine. You may need to check your blood pressure at home:  To check if you have high blood pressure (hypertension).  To check your blood pressure over time.  To make sure your blood pressure medicine is working.  Supplies needed: You will need a blood pressure machine, or monitor. You can buy one at a drugstore or online. When choosing one:  Choose one with an arm cuff.  Choose one that wraps around your upper arm. Only one finger should fit between your arm and the cuff.  Do not choose one that measures your blood pressure from your wrist or finger.  Your doctor can suggest a monitor. How to prepare Avoid these things for 30 minutes before checking your blood pressure:  Drinking caffeine.  Drinking alcohol.  Eating.  Smoking.  Exercising.  Five minutes before checking your blood pressure:  Pee.  Sit in a dining chair. Avoid sitting in a soft couch or armchair.  Be quiet. Do not talk.  How to take your blood pressure Follow the instructions that came with your machine. If you have a digital blood pressure monitor, these may  be the instructions: 1. Sit up straight. 2. Place your feet on the floor. Do not cross your ankles or legs. 3. Rest your left arm at the level of your heart. You may rest it on a table, desk, or chair. 4. Pull up your shirt sleeve. 5. Wrap the blood pressure cuff around the upper part of your left arm. The cuff should be 1 inch (2.5 cm) above your elbow. It is best to wrap the cuff around bare skin. 6. Fit the cuff snugly around your arm. You should be able to place only one finger between the cuff and your arm. 7. Put the cord inside the groove of your elbow. 8. Press the power button. 9. Sit quietly while the cuff fills with air and loses air. 10. Write down the numbers on the screen. 11. Wait 2-3 minutes and then repeat steps 1-10.  What do the numbers mean? Two numbers make up your blood pressure. The first number is called systolic pressure. The second is called diastolic pressure. An example of a blood pressure reading is "120 over 80" (or 120/80). If you are an adult and do not have a medical condition, use this guide to find out if your blood pressure is normal: Normal  First number: below 120.  Second number: below 80. Elevated  First number: 120-129.  Second number: below 80. Hypertension stage 1  First number: 130-139.  Second number: 80-89. Hypertension stage 2  First number: 140 or above.    Second number: 90 or above. Your blood pressure is above normal even if only the top or bottom number is above normal. Follow these instructions at home:  Check your blood pressure as often as your doctor tells you to.  Take your monitor to your next doctor's appointment. Your doctor will: ? Make sure you are using it correctly. ? Make sure it is working right.  Make sure you understand what your blood pressure numbers should be.  Tell your doctor if your medicines are causing side effects. Contact a doctor if:  Your blood pressure keeps being high. Get help right  away if:  Your first blood pressure number is higher than 180.  Your second blood pressure number is higher than 120. This information is not intended to replace advice given to you by your health care provider. Make sure you discuss any questions you have with your health care provider. Document Released: 12/20/2007 Document Revised: 12/05/2015 Document Reviewed: 06/15/2015 Elsevier Interactive Patient Education  2018 [employer] Inc[employer]  Hypertension Hypertension is another name for high blood pressure. High blood pressure forces your heart to work harder to pump blood. This can cause problems over time. There are two numbers in a blood pressure reading. There is a top number (systolic) over a bottom number (diastolic). It is best to have a blood pressure below 120/80. Healthy choices can help lower your blood pressure. You may need medicine to help lower your blood pressure if:  Your blood pressure cannot be lowered with healthy choices.  Your blood pressure is higher than 130/80.  Follow these instructions at home: Eating and drinking  If directed, follow the DASH eating plan. This diet includes: ? Filling half of your plate at each meal with fruits and vegetables. ? Filling one quarter of your plate at each meal with whole grains. Whole grains include whole wheat pasta, brown rice, and whole grain bread. ? Eating or drinking low-fat dairy products, such as skim milk or low-fat yogurt. ? Filling one quarter of your plate at each meal with low-fat (lean) proteins. Low-fat proteins include fish, skinless chicken, eggs, beans, and tofu. ? Avoiding fatty meat, cured and processed meat, or chicken with skin. ? Avoiding premade or processed food.  Eat less than 1,500 mg of salt (sodium) a day.  Limit alcohol use to no more than 1 drink a day for nonpregnant women and 2 drinks a day for men. One drink equals 12 oz of beer, 5 oz of wine, or 1 oz of hard liquor. Lifestyle  Work with your  doctor to stay at a healthy weight or to lose weight. Ask your doctor what the best weight is for you.  Get at least 30 minutes of exercise that causes your heart to beat faster (aerobic exercise) most days of the week. This may include walking, swimming, or biking.  Get at least 30 minutes of exercise that strengthens your muscles (resistance exercise) at least 3 days a week. This may include lifting weights or pilates.  Do not use any products that contain nicotine or tobacco. This includes cigarettes and e-cigarettes. If you need help quitting, ask your doctor.  Check your blood pressure at home as told by your doctor.  Keep all follow-up visits as told by your doctor. This is important. Medicines  Take over-the-counter and prescription medicines only as told by your doctor. Follow directions carefully.  Do not skip doses of blood pressure medicine. The medicine does not work as well if you skip doses.   Skipping doses also puts you at risk for problems.  Ask your doctor about side effects or reactions to medicines that you should watch for. Contact a doctor if:  You think you are having a reaction to the medicine you are taking.  You have headaches that keep coming back (recurring).  You feel dizzy.  You have swelling in your ankles.  You have trouble with your vision. Get help right away if:  You get a very bad headache.  You start to feel confused.  You feel weak or numb.  You feel faint.  You get very bad pain in your: ? Chest. ? Belly (abdomen).  You throw up (vomit) more than once.  You have trouble breathing. Summary  Hypertension is another name for high blood pressure.  Making healthy choices can help lower blood pressure. If your blood pressure cannot be controlled with healthy choices, you may need to take medicine. This information is not intended to replace advice given to you by your health care provider. Make sure you discuss any questions you have  with your health care provider. Document Released: 06/25/2007 Document Revised: 12/05/2015 Document Reviewed: 12/05/2015 Elsevier Interactive Patient Education  2018 [employer]vier Inc.  

## 2016-10-22 ENCOUNTER — Other Ambulatory Visit: Payer: Self-pay | Admitting: Emergency Medicine

## 2016-10-22 DIAGNOSIS — E1165 Type 2 diabetes mellitus with hyperglycemia: Secondary | ICD-10-CM

## 2016-10-22 LAB — CBC WITH DIFFERENTIAL/PLATELET
BASOS: 1 %
Basophils Absolute: 0 10*3/uL (ref 0.0–0.2)
EOS (ABSOLUTE): 0.2 10*3/uL (ref 0.0–0.4)
EOS: 4 %
HEMATOCRIT: 51 % (ref 37.5–51.0)
Hemoglobin: 17.6 g/dL (ref 13.0–17.7)
IMMATURE GRANS (ABS): 0 10*3/uL (ref 0.0–0.1)
IMMATURE GRANULOCYTES: 0 %
Lymphocytes Absolute: 1.5 10*3/uL (ref 0.7–3.1)
Lymphs: 31 %
MCH: 32.1 pg (ref 26.6–33.0)
MCHC: 34.5 g/dL (ref 31.5–35.7)
MCV: 93 fL (ref 79–97)
MONOS ABS: 0.4 10*3/uL (ref 0.1–0.9)
Monocytes: 9 %
NEUTROS ABS: 2.6 10*3/uL (ref 1.4–7.0)
NEUTROS PCT: 55 %
Platelets: 183 10*3/uL (ref 150–379)
RBC: 5.48 x10E6/uL (ref 4.14–5.80)
RDW: 13.3 % (ref 12.3–15.4)
WBC: 4.8 10*3/uL (ref 3.4–10.8)

## 2016-10-22 LAB — HEMOGLOBIN A1C
ESTIMATED AVERAGE GLUCOSE: 263 mg/dL
HEMOGLOBIN A1C: 10.8 % — AB (ref 4.8–5.6)

## 2016-10-22 LAB — COMPREHENSIVE METABOLIC PANEL
A/G RATIO: 1.2 (ref 1.2–2.2)
ALT: 51 IU/L — ABNORMAL HIGH (ref 0–44)
AST: 30 IU/L (ref 0–40)
Albumin: 3.7 g/dL (ref 3.5–5.5)
Alkaline Phosphatase: 71 IU/L (ref 39–117)
BILIRUBIN TOTAL: 1 mg/dL (ref 0.0–1.2)
BUN / CREAT RATIO: 10 (ref 9–20)
BUN: 9 mg/dL (ref 6–24)
CALCIUM: 9.7 mg/dL (ref 8.7–10.2)
CO2: 23 mmol/L (ref 20–29)
Chloride: 101 mmol/L (ref 96–106)
Creatinine, Ser: 0.87 mg/dL (ref 0.76–1.27)
GFR calc Af Amer: 116 mL/min/{1.73_m2} (ref >59)
GFR calc non Af Amer: 101 mL/min/{1.73_m2} (ref >59)
GLOBULIN, TOTAL: 3 g/dL (ref 1.5–4.5)
Glucose: 287 mg/dL — ABNORMAL HIGH (ref 65–99)
POTASSIUM: 4.3 mmol/L (ref 3.5–5.2)
SODIUM: 138 mmol/L (ref 134–144)
TOTAL PROTEIN: 6.7 g/dL (ref 6.0–8.5)

## 2017-01-23 ENCOUNTER — Ambulatory Visit: Payer: PRIVATE HEALTH INSURANCE | Admitting: Emergency Medicine

## 2017-02-22 ENCOUNTER — Other Ambulatory Visit: Payer: Self-pay | Admitting: Emergency Medicine

## 2017-02-23 ENCOUNTER — Other Ambulatory Visit: Payer: Self-pay | Admitting: *Deleted

## 2017-02-23 MED ORDER — SERTRALINE HCL 50 MG PO TABS: 50.0000 mg | ORAL_TABLET | Freq: Every day | ORAL | 0 refills | Status: DC

## 2017-02-24 ENCOUNTER — Other Ambulatory Visit: Payer: Self-pay | Admitting: Emergency Medicine

## 2017-03-21 ENCOUNTER — Other Ambulatory Visit: Payer: Self-pay | Admitting: Emergency Medicine

## 2017-03-23 NOTE — Telephone Encounter (Signed)
sertraline refill Last OV: 10/21/16 PCP: Dr Alvy BimlerSagardia Pharmacy:[facility] [id number]

## 2017-03-26 ENCOUNTER — Encounter: Payer: Self-pay | Admitting: Emergency Medicine

## 2017-03-26 ENCOUNTER — Other Ambulatory Visit: Payer: Self-pay

## 2017-03-26 ENCOUNTER — Ambulatory Visit (INDEPENDENT_AMBULATORY_CARE_PROVIDER_SITE_OTHER): Payer: PRIVATE HEALTH INSURANCE | Admitting: Emergency Medicine

## 2017-03-26 VITALS — BP 116/70 | HR 79 | Temp 98.1°F | Resp 16 | Ht 67.0 in | Wt 250.0 lb

## 2017-03-26 DIAGNOSIS — R1031 Right lower quadrant pain: Secondary | ICD-10-CM | POA: Diagnosis not present

## 2017-03-26 DIAGNOSIS — E119 Type 2 diabetes mellitus without complications: Secondary | ICD-10-CM | POA: Diagnosis not present

## 2017-03-26 DIAGNOSIS — I10 Essential (primary) hypertension: Secondary | ICD-10-CM

## 2017-03-26 DIAGNOSIS — Z23 Encounter for immunization: Secondary | ICD-10-CM | POA: Diagnosis not present

## 2017-03-26 LAB — POCT GLYCOSYLATED HEMOGLOBIN (HGB A1C): HEMOGLOBIN A1C: 10.4

## 2017-03-26 MED ORDER — METFORMIN HCL 1000 MG PO TABS: 1000.0000 mg | ORAL_TABLET | Freq: Two times a day (BID) | ORAL | 3 refills | Status: DC

## 2017-03-26 MED ORDER — BLOOD GLUCOSE MONITOR KIT: PACK | 0 refills | Status: AC

## 2017-03-26 MED ORDER — GLIPIZIDE 5 MG PO TABS: 5.0000 mg | ORAL_TABLET | Freq: Every day | ORAL | 3 refills | Status: DC

## 2017-03-26 NOTE — Progress Notes (Addendum)
Clifford Mason 52 y.o.   Chief Complaint  Patient presents with  . Diabetes    FOLLOW UP  . Flank Pain    RIGHT side x 1 week    HISTORY OF PRESENT ILLNESS: This is a 52 y.o. male with a history of hypertension and diabetes here for diabetes follow-up as scheduled.  Diabetes symptoms such as increased thirst, increased urination, increase fluid intake are much improved. Also noticed intermittent short-lived sharp pain to right mid lower abdomen not associated with any other symptoms.  Pain-free at present time.  No history of gallstones or hernias. Lab Results  Component Value Date   HGBA1C 10.8 (H) 10/21/2016     HPI   Prior to Admission medications   Medication Sig Start Date End Date Taking? Authorizing Provider  sertraline (ZOLOFT) 50 MG tablet Take 1 tablet (50 mg total) by mouth daily. 02/23/17  Yes Dezyre Hoefer, Eilleen KempfMiguel Jose, MD  diltiazem (CARDIZEM CD) 240 MG 24 hr capsule Take 1 capsule (240 mg total) by mouth daily. 10/21/16 01/19/17  Georgina QuintSagardia, Alissia Lory Jose, MD  losartan (COZAAR) 50 MG tablet Take 1 tablet (50 mg total) by mouth daily. 10/21/16 01/19/17  Georgina QuintSagardia, Cassell Voorhies Jose, MD  metFORMIN (GLUCOPHAGE) 500 MG tablet Take 1 tablet (500 mg total) by mouth 2 (two) times daily with a meal. 10/21/16 01/19/17  Georgina QuintSagardia, Mishaal Lansdale Jose, MD  sertraline (ZOLOFT) 50 MG tablet TAKE 1 TABLET BY MOUTH EVERY DAY 03/23/17   Georgina QuintSagardia, Saamiya Jeppsen Jose, MD    No Known Allergies  Patient Active Problem List   Diagnosis Date Noted  . GAD (generalized anxiety disorder) 10/21/2016  . Morbid obesity due to excess calories (HCC) 01/01/2015  . Adjustment disorder with depressed mood 01/01/2015  . Hypersomnia with sleep apnea 01/01/2015  . Snoring 01/01/2015  . Sleep disorder, circadian, shift work type 01/01/2015  . Diabetes (HCC) 12/08/2014  . Essential hypertension 12/08/2014    Past Medical History:  Diagnosis Date  . Diabetes mellitus without complication (HCC)   . Heart murmur   .  Hypertension     No past surgical history on file.  Social History   Socioeconomic History  . Marital status: Single    Spouse name: Not on file  . Number of children: Not on file  . Years of education: Not on file  . Highest education level: Not on file  Social Needs  . Financial resource strain: Not on file  . Food insecurity - worry: Not on file  . Food insecurity - inability: Not on file  . Transportation needs - medical: Not on file  . Transportation needs - non-medical: Not on file  Occupational History  . Occupation: Hotel  Tobacco Use  . Smoking status: Never Smoker  . Smokeless tobacco: Never Used  Substance and Sexual Activity  . Alcohol use: Yes    Alcohol/week: 0.0 oz  . Drug use: No  . Sexual activity: Not on file  Other Topics Concern  . Not on file  Social History Narrative   Works third shift at a hotel.      Drinks occasional soda 1-2 week.    Family History  Problem Relation Age of Onset  . Healthy Mother      Review of Systems  Constitutional: Negative.  Negative for chills and fever.  HENT: Negative.   Eyes: Negative.  Negative for blurred vision and double vision.  Respiratory: Negative.  Negative for cough and shortness of breath.   Cardiovascular: Negative.  Negative for chest pain and  palpitations.  Gastrointestinal: Positive for abdominal pain (Intermittent).  Genitourinary: Negative.  Negative for dysuria.  Musculoskeletal: Negative for back pain, myalgias and neck pain.  Skin: Negative for rash.  Neurological: Negative.  Negative for dizziness and headaches.  Endo/Heme/Allergies: Negative.   All other systems reviewed and are negative.   Vitals:   03/26/17 0806  BP: 116/70  Pulse: 79  Resp: 16  Temp: 98.1 F (36.7 C)  SpO2: 96%    Physical Exam  Constitutional: He is oriented to person, place, and time. He appears well-developed and well-nourished.  HENT:  Head: Normocephalic and atraumatic.  Nose: Nose normal.    Mouth/Throat: Oropharynx is clear and moist.  Eyes: Conjunctivae and EOM are normal. Pupils are equal, round, and reactive to light.  Neck: Normal range of motion. Neck supple.  Cardiovascular: Normal rate and regular rhythm.  No murmur heard. Split S1.  Pulmonary/Chest: Effort normal and breath sounds normal.  Abdominal: Soft. Bowel sounds are normal. He exhibits no distension and no mass. There is no tenderness. There is no rebound and no guarding. No hernia.  Musculoskeletal: Normal range of motion.  Neurological: He is alert and oriented to person, place, and time. No sensory deficit. He exhibits normal muscle tone.  Skin: Skin is warm and dry. Capillary refill takes less than 2 seconds. No rash noted.  Psychiatric: He has a normal mood and affect. His behavior is normal.  Vitals reviewed.  A total of 30 minutes was spent in the room with the patient, greater than 50% of which was in counseling/coordination of care.  Diabetes (HCC) Hemoglobin A1c off target.  Today is 10.4. Last's was 10.8.  Encourage to follow diabetic diet.  Will add glipizide and increase metformin.  We will follow-up in 3 months.  Right lower quadrant abdominal pain No red flag signs or symptoms.  Intermittent pain.  Benign abdomen.  We will request abdominal ultrasound.   ASSESSMENT & PLAN: [name] was seen today for diabetes and flank pain.  Diagnoses and all orders for this visit:  Type 2 diabetes mellitus without complication, without long-term current use of insulin (HCC) -     POCT glycosylated hemoglobin (Hb A1C) -     glipiZIDE (GLUCOTROL) 5 MG tablet; Take 1 tablet (5 mg total) by mouth daily before breakfast. -     metFORMIN (GLUCOPHAGE) 1000 MG tablet; Take 1 tablet (1,000 mg total) by mouth 2 (two) times daily with a meal.  Essential hypertension  Need for diphtheria-tetanus-pertussis (Tdap) vaccine -     Tdap vaccine greater than or equal to 7yo IM  Right lower quadrant abdominal  pain Comments: Intermittent Orders: -     US Abdomen Complete; Future    Patient Instructions       IF you received an x-ray today, you will receive an invoice from [facility]. Please contact [facility] at [phone] with questions or concerns regarding your invoice.   IF you received labwork today, you will receive an invoice from [place]. Please contact [facility] at [phone] with questions or concerns regarding your invoice.   Our billing staff will not be able to assist you with questions regarding bills from these companies.  You will be contacted with the lab results as soon as they are available. The fastest way to get your results is to activate your My Chart account. Instructions are located on the last page of this paperwork. If you have not heard from [place] regarding the results in 2 weeks, please contact this  office.     Diabetes Mellitus and Nutrition When you have diabetes (diabetes mellitus), it is very important to have healthy eating habits because your blood sugar (glucose) levels are greatly affected by what you eat and drink. Eating healthy foods in the appropriate amounts, at about the same times every day, can help you:  Control your blood glucose.  Lower your risk of heart disease.  Improve your blood pressure.  Reach or maintain a healthy weight.  Every person with diabetes is different, and each person has different needs for a meal plan. Your health care provider may recommend that you work with a diet and nutrition specialist (dietitian) to make a meal plan that is best for you. Your meal plan may vary depending on factors such as:  The calories you need.  The medicines you take.  Your weight.  Your blood glucose, blood pressure, and cholesterol levels.  Your activity level.  Other health conditions you have, such as heart or kidney disease.  How do carbohydrates affect me? Carbohydrates affect your blood glucose level  more than any other type of food. Eating carbohydrates naturally increases the amount of glucose in your blood. Carbohydrate counting is a method for keeping track of how many carbohydrates you eat. Counting carbohydrates is important to keep your blood glucose at a healthy level, especially if you use insulin or take certain oral diabetes medicines. It is important to know how many carbohydrates you can safely have in each meal. This is different for every person. Your dietitian can help you calculate how many carbohydrates you should have at each meal and for snack. Foods that contain carbohydrates include:  Bread, cereal, rice, pasta, and crackers.  Potatoes and corn.  Peas, beans, and lentils.  Milk and yogurt.  Fruit and juice.  Desserts, such as cakes, cookies, ice cream, and candy.  How does alcohol affect me? Alcohol can cause a sudden decrease in blood glucose (hypoglycemia), especially if you use insulin or take certain oral diabetes medicines. Hypoglycemia can be a life-threatening condition. Symptoms of hypoglycemia (sleepiness, dizziness, and confusion) are similar to symptoms of having too much alcohol. If your health care provider says that alcohol is safe for you, follow these guidelines:  Limit alcohol intake to no more than 1 drink per day for nonpregnant women and 2 drinks per day for men. One drink equals 12 oz of beer, 5 oz of wine, or 1 oz of hard liquor.  Do not drink on an empty stomach.  Keep yourself hydrated with water, diet soda, or unsweetened iced tea.  Keep in mind that regular soda, juice, and other mixers may contain a lot of sugar and must be counted as carbohydrates.  What are tips for following this plan? Reading food labels  Start by checking the serving size on the label. The amount of calories, carbohydrates, fats, and other nutrients listed on the label are based on one serving of the food. Many foods contain more than one serving per  package.  Check the total grams (g) of carbohydrates in one serving. You can calculate the number of servings of carbohydrates in one serving by dividing the total carbohydrates by 15. For example, if a food has 30 g of total carbohydrates, it would be equal to 2 servings of carbohydrates.  Check the number of grams (g) of saturated and trans fats in one serving. Choose foods that have low or no amount of these fats.  Check the number of milligrams (mg) of  sodium in one serving. Most people should limit total sodium intake to less than 2,300 mg per day.  Always check the nutrition information of foods labeled as "low-fat" or "nonfat". These foods may be higher in added sugar or refined carbohydrates and should be avoided.  Talk to your dietitian to identify your daily goals for nutrients listed on the label. Shopping  Avoid buying canned, premade, or processed foods. These foods tend to be high in fat, sodium, and added sugar.  Shop around the outside edge of the grocery store. This includes fresh fruits and vegetables, bulk grains, fresh meats, and fresh dairy. Cooking  Use low-heat cooking methods, such as baking, instead of high-heat cooking methods like deep frying.  Cook using healthy oils, such as olive, canola, or sunflower oil.  Avoid cooking with butter, cream, or high-fat meats. Meal planning  Eat meals and snacks regularly, preferably at the same times every day. Avoid going long periods of time without eating.  Eat foods high in fiber, such as fresh fruits, vegetables, beans, and whole grains. Talk to your dietitian about how many servings of carbohydrates you can eat at each meal.  Eat 4-6 ounces of lean protein each day, such as lean meat, chicken, fish, eggs, or tofu. 1 ounce is equal to 1 ounce of meat, chicken, or fish, 1 egg, or 1/4 cup of tofu.  Eat some foods each day that contain healthy fats, such as avocado, nuts, seeds, and fish. Lifestyle   Check your blood  glucose regularly.  Exercise at least 30 minutes 5 or more days each week, or as told by your health care provider.  Take medicines as told by your health care provider.  Do not use any products that contain nicotine or tobacco, such as cigarettes and e-cigarettes. If you need help quitting, ask your health care provider.  Work with a Veterinary surgeon or diabetes educator to identify strategies to manage stress and any emotional and social challenges. What are some questions to ask my health care provider?  Do I need to meet with a diabetes educator?  Do I need to meet with a dietitian?  What number can I call if I have questions?  When are the best times to check my blood glucose? Where to find more information:  American Diabetes Association: diabetes.org/food-and-fitness/food  Academy of Nutrition and Dietetics: https://www.vargas.com/  [employer] of Diabetes and Digestive and Kidney Diseases (NIH): FindJewelers.cz Summary  A healthy meal plan will help you control your blood glucose and maintain a healthy lifestyle.  Working with a diet and nutrition specialist (dietitian) can help you make a meal plan that is best for you.  Keep in mind that carbohydrates and alcohol have immediate effects on your blood glucose levels. It is important to count carbohydrates and to use alcohol carefully. This information is not intended to replace advice given to you by your health care provider. Make sure you discuss any questions you have with your health care provider. Document Released: 10/03/2004 Document Revised: 02/11/2016 Document Reviewed: 02/11/2016 Elsevier Interactive Patient Education  2018 [employer].      Edwina Barth, MD [facility]

## 2017-03-26 NOTE — Patient Instructions (Addendum)
   IF you received an x-ray today, you will receive an invoice from [facility]. Please contact [facility] at [phone] with questions or concerns regarding your invoice.   IF you received labwork today, you will receive an invoice from [facility]. Please contact [facility] at [phone] with questions or concerns regarding your invoice.   Our billing staff will not be able to assist you with questions regarding bills from these companies.  You will be contacted with the lab results as soon as they are available. The fastest way to get your results is to activate your My Chart account. Instructions are located on the last page of this paperwork. If you have not heard from us regarding the results in 2 weeks, please contact this office.     Diabetes Mellitus and Nutrition When you have diabetes (diabetes mellitus), it is very important to have healthy eating habits because your blood sugar (glucose) levels are greatly affected by what you eat and drink. Eating healthy foods in the appropriate amounts, at about the same times every day, can help you:  Control your blood glucose.  Lower your risk of heart disease.  Improve your blood pressure.  Reach or maintain a healthy weight.  Every person with diabetes is different, and each person has different needs for a meal plan. Your health care provider may recommend that you work with a diet and nutrition specialist (dietitian) to make a meal plan that is best for you. Your meal plan may vary depending on factors such as:  The calories you need.  The medicines you take.  Your weight.  Your blood glucose, blood pressure, and cholesterol levels.  Your activity level.  Other health conditions you have, such as heart or kidney disease.  How do carbohydrates affect me? Carbohydrates affect your blood glucose level more than any other type of food. Eating carbohydrates naturally increases the amount of glucose in your  blood. Carbohydrate counting is a method for keeping track of how many carbohydrates you eat. Counting carbohydrates is important to keep your blood glucose at a healthy level, especially if you use insulin or take certain oral diabetes medicines. It is important to know how many carbohydrates you can safely have in each meal. This is different for every person. Your dietitian can help you calculate how many carbohydrates you should have at each meal and for snack. Foods that contain carbohydrates include:  Bread, cereal, rice, pasta, and crackers.  Potatoes and corn.  Peas, beans, and lentils.  Milk and yogurt.  Fruit and juice.  Desserts, such as cakes, cookies, ice cream, and candy.  How does alcohol affect me? Alcohol can cause a sudden decrease in blood glucose (hypoglycemia), especially if you use insulin or take certain oral diabetes medicines. Hypoglycemia can be a life-threatening condition. Symptoms of hypoglycemia (sleepiness, dizziness, and confusion) are similar to symptoms of having too much alcohol. If your health care provider says that alcohol is safe for you, follow these guidelines:  Limit alcohol intake to no more than 1 drink per day for nonpregnant women and 2 drinks per day for men. One drink equals 12 oz of beer, 5 oz of wine, or 1 oz of hard liquor.  Do not drink on an empty stomach.  Keep yourself hydrated with water, diet soda, or unsweetened iced tea.  Keep in mind that regular soda, juice, and other mixers may contain a lot of sugar and must be counted as carbohydrates.  What are tips for following   this plan? Reading food labels  Start by checking the serving size on the label. The amount of calories, carbohydrates, fats, and other nutrients listed on the label are based on one serving of the food. Many foods contain more than one serving per package.  Check the total grams (g) of carbohydrates in one serving. You can calculate the number of servings of  carbohydrates in one serving by dividing the total carbohydrates by 15. For example, if a food has 30 g of total carbohydrates, it would be equal to 2 servings of carbohydrates.  Check the number of grams (g) of saturated and trans fats in one serving. Choose foods that have low or no amount of these fats.  Check the number of milligrams (mg) of sodium in one serving. Most people should limit total sodium intake to less than 2,300 mg per day.  Always check the nutrition information of foods labeled as "low-fat" or "nonfat". These foods may be higher in added sugar or refined carbohydrates and should be avoided.  Talk to your dietitian to identify your daily goals for nutrients listed on the label. Shopping  Avoid buying canned, premade, or processed foods. These foods tend to be high in fat, sodium, and added sugar.  Shop around the outside edge of the grocery store. This includes fresh fruits and vegetables, bulk grains, fresh meats, and fresh dairy. Cooking  Use low-heat cooking methods, such as baking, instead of high-heat cooking methods like deep frying.  Cook using healthy oils, such as olive, canola, or sunflower oil.  Avoid cooking with butter, cream, or high-fat meats. Meal planning  Eat meals and snacks regularly, preferably at the same times every day. Avoid going long periods of time without eating.  Eat foods high in fiber, such as fresh fruits, vegetables, beans, and whole grains. Talk to your dietitian about how many servings of carbohydrates you can eat at each meal.  Eat 4-6 ounces of lean protein each day, such as lean meat, chicken, fish, eggs, or tofu. 1 ounce is equal to 1 ounce of meat, chicken, or fish, 1 egg, or 1/4 cup of tofu.  Eat some foods each day that contain healthy fats, such as avocado, nuts, seeds, and fish. Lifestyle   Check your blood glucose regularly.  Exercise at least 30 minutes 5 or more days each week, or as told by your health care  provider.  Take medicines as told by your health care provider.  Do not use any products that contain nicotine or tobacco, such as cigarettes and e-cigarettes. If you need help quitting, ask your health care provider.  Work with a counselor or diabetes educator to identify strategies to manage stress and any emotional and social challenges. What are some questions to ask my health care provider?  Do I need to meet with a diabetes educator?  Do I need to meet with a dietitian?  What number can I call if I have questions?  When are the best times to check my blood glucose? Where to find more information:  American Diabetes Association: diabetes.org/food-and-fitness/food  Academy of Nutrition and Dietetics: www.eatright.org/resources/health/diseases-and-conditions/diabetes  National Institute of Diabetes and Digestive and Kidney Diseases (NIH): www.niddk.nih.gov/health-information/diabetes/overview/diet-eating-physical-activity Summary  A healthy meal plan will help you control your blood glucose and maintain a healthy lifestyle.  Working with a diet and nutrition specialist (dietitian) can help you make a meal plan that is best for you.  Keep in mind that carbohydrates and alcohol have immediate effects on your blood glucose   levels. It is important to count carbohydrates and to use alcohol carefully. This information is not intended to replace advice given to you by your health care provider. Make sure you discuss any questions you have with your health care provider. Document Released: 10/03/2004 Document Revised: 02/11/2016 Document Reviewed: 02/11/2016 Elsevier Interactive Patient Education  2018 Elsevier Inc.  

## 2017-03-26 NOTE — Addendum Note (Signed)
Addended by: Evie Lacks on: 03/26/2017 08:58 AM   Modules accepted: Orders

## 2017-03-26 NOTE — Assessment & Plan Note (Signed)
No red flag signs or symptoms.  Intermittent pain.  Benign abdomen.  We will request abdominal ultrasound.

## 2017-03-26 NOTE — Assessment & Plan Note (Addendum)
Hemoglobin A1c off target.  Today is 10.4. Last's was 10.8.  Encourage to follow diabetic diet.  Will add glipizide and increase metformin.  We will follow-up in 3 months.

## 2017-04-21 ENCOUNTER — Other Ambulatory Visit: Payer: Self-pay | Admitting: Emergency Medicine

## 2017-05-13 ENCOUNTER — Telehealth: Payer: Self-pay | Admitting: *Deleted

## 2017-05-13 NOTE — Telephone Encounter (Signed)
Faxed request to close potential gap in therapy for medication statin. Dr Alvy BimlerSagardia will discuss with patient at next visit. Confirmation page received at 5:13 pm.

## 2017-05-30 ENCOUNTER — Other Ambulatory Visit: Payer: Self-pay | Admitting: Emergency Medicine

## 2017-06-11 ENCOUNTER — Encounter: Payer: Self-pay | Admitting: Family Medicine

## 2017-06-26 ENCOUNTER — Ambulatory Visit: Payer: PRIVATE HEALTH INSURANCE | Admitting: Emergency Medicine

## 2017-07-08 ENCOUNTER — Other Ambulatory Visit: Payer: Self-pay | Admitting: Emergency Medicine

## 2017-07-08 NOTE — Telephone Encounter (Signed)
Requesting refill, please advise. Thank you 

## 2017-07-10 ENCOUNTER — Other Ambulatory Visit: Payer: Self-pay | Admitting: Emergency Medicine

## 2017-07-10 ENCOUNTER — Other Ambulatory Visit: Payer: Self-pay | Admitting: *Deleted

## 2017-07-10 NOTE — Telephone Encounter (Signed)
OK to refill  Thanks

## 2017-07-15 ENCOUNTER — Other Ambulatory Visit: Payer: Self-pay | Admitting: Emergency Medicine

## 2017-07-15 MED ORDER — SERTRALINE HCL 50 MG PO TABS: 50.0000 mg | ORAL_TABLET | Freq: Every day | ORAL | 1 refills | Status: DC

## 2019-03-25 ENCOUNTER — Ambulatory Visit: Payer: Self-pay | Attending: Internal Medicine

## 2023-02-01 ENCOUNTER — Emergency Department (HOSPITAL_BASED_OUTPATIENT_CLINIC_OR_DEPARTMENT_OTHER): Payer: PRIVATE HEALTH INSURANCE | Admitting: Radiology

## 2023-02-01 ENCOUNTER — Emergency Department (HOSPITAL_BASED_OUTPATIENT_CLINIC_OR_DEPARTMENT_OTHER)
Admission: EM | Admit: 2023-02-01 | Discharge: 2023-02-01 | Disposition: A | Discharge status: Home / Self Care | Payer: PRIVATE HEALTH INSURANCE | Attending: Emergency Medicine | Admitting: Emergency Medicine

## 2023-02-01 ENCOUNTER — Encounter (HOSPITAL_BASED_OUTPATIENT_CLINIC_OR_DEPARTMENT_OTHER): Payer: Self-pay | Admitting: Emergency Medicine

## 2023-02-01 ENCOUNTER — Other Ambulatory Visit: Payer: Self-pay

## 2023-02-01 DIAGNOSIS — X501XXA Overexertion from prolonged static or awkward postures, initial encounter: Secondary | ICD-10-CM | POA: Insufficient documentation

## 2023-02-01 DIAGNOSIS — S82431A Displaced oblique fracture of shaft of right fibula, initial encounter for closed fracture: Secondary | ICD-10-CM | POA: Diagnosis not present

## 2023-02-01 DIAGNOSIS — S99911A Unspecified injury of right ankle, initial encounter: Secondary | ICD-10-CM | POA: Diagnosis present

## 2023-02-01 MED ORDER — OXYCODONE-ACETAMINOPHEN 5-325 MG PO TABS: 1.0000 | ORAL_TABLET | Freq: Four times a day (QID) | ORAL | 0 refills | Status: DC | PRN

## 2023-02-01 MED ORDER — OXYCODONE-ACETAMINOPHEN 5-325 MG PO TABS
1.0000 | ORAL_TABLET | Freq: Once | ORAL | Status: AC
  Administered 2023-02-01: 1 via ORAL
  Filled 2023-02-01: qty 1

## 2023-02-01 NOTE — ED Triage Notes (Signed)
 Pt slipped on ice, his right ankle is swollen and painful. This happened yesterday.

## 2023-02-01 NOTE — ED Provider Notes (Signed)
  EMERGENCY DEPARTMENT AT [facility] Provider Note   CSN: [id number] Arrival date & time: 02/01/23  1126     History  No chief complaint on file.   Clifford Mason is a 58 y.o. male.  Patient presents to the emergency department today for evaluation of right ankle injury.  Patient slipped on ice and thinks that he may have twisted his ankle.  He denies knee pain or hip pain.  No distal numbness or tingling.  He is not able to bear weight on the ankle.  He is concerned that he has a sprain.  He has been taking over-the-counter medications.       Home Medications Prior to Admission medications   Medication Sig Start Date End Date Taking? Authorizing Provider  oxyCODONE -acetaminophen  (PERCOCET/ROXICET) 5-325 MG tablet Take 1 tablet by mouth every 6 (six) hours as needed for severe pain (pain score 7-10). 02/01/23  Yes Desiderio Chew, PA-C  blood glucose meter kit and supplies KIT Dispense based on patient and insurance preference. Use up to four times daily as directed. 03/26/17   Purcell Emil Schanz, MD  diltiazem  (CARDIZEM  CD) 240 MG 24 hr capsule Take 1 capsule (240 mg total) by mouth daily. 10/21/16 01/19/17  Purcell Emil Schanz, MD  glipiZIDE  (GLUCOTROL ) 5 MG tablet Take 1 tablet (5 mg total) by mouth daily before breakfast. 03/26/17 06/24/17  Purcell Emil Schanz, MD  losartan  (COZAAR ) 50 MG tablet Take 1 tablet (50 mg total) by mouth daily. 10/21/16 01/19/17  Purcell Emil Schanz, MD  metFORMIN  (GLUCOPHAGE ) 1000 MG tablet Take 1 tablet (1,000 mg total) by mouth 2 (two) times daily with a meal. 03/26/17   Sagardia, Emil Schanz, MD  sertraline  (ZOLOFT ) 50 MG tablet Take 1 tablet (50 mg total) by mouth daily. 07/15/17 10/13/17  Purcell Emil Schanz, MD      Allergies    Patient has no known allergies.    Review of Systems   Review of Systems  Physical Exam Updated Vital Signs BP (!) 141/94 (BP Location: Left Arm)   Pulse 96   Temp (!) 96.3 F (35.7 C)    Resp 20   Wt 108.9 kg   SpO2 97%   BMI 37.59 kg/m   Physical Exam Vitals and nursing note reviewed.  Constitutional:      Appearance: He is well-developed.  HENT:     Head: Normocephalic and atraumatic.  Eyes:     Conjunctiva/sclera: Conjunctivae normal.  Cardiovascular:     Pulses: Normal pulses. No decreased pulses.          Dorsalis pedis pulses are 2+ on the right side and 2+ on the left side.       Posterior tibial pulses are 2+ on the right side and 2+ on the left side.  Pulmonary:     Effort: No respiratory distress.  Musculoskeletal:        General: Tenderness present.     Cervical back: Normal range of motion and neck supple.     Right knee: Normal range of motion. No tenderness.     Right lower leg: No edema.     Left lower leg: No edema.     Right ankle: Tenderness present over the lateral malleolus. No proximal fibula tenderness. Decreased range of motion.     Comments: 2+ DP pulse on the right  Skin:    General: Skin is warm and dry.  Neurological:     Mental Status: He is alert.     Sensory:  No sensory deficit.     Comments: Motor, sensation, and vascular distal to the injury is fully intact.   Psychiatric:        Mood and Affect: Mood normal.     ED Results / Procedures / Treatments   Labs (all labs ordered are listed, but only abnormal results are displayed) Labs Reviewed - No data to display  EKG None  Radiology DG Ankle Complete Right Result Date: 02/01/2023 CLINICAL DATA:  58 year old male status post fall on ice yesterday. Continued pain. EXAM: RIGHT ANKLE - COMPLETE 3+ VIEW COMPARISON:  None Available. FINDINGS: Soft tissue swelling at the ankle. Oblique fracture of the distal fibula metadiaphysis tracking to the level of the tibial plafond. Slight lateral subluxation of the mortise joint. Talar dome appears intact. Distal tibia and calcaneus appear intact. Calcaneus degenerative spurring. No joint effusion identified on the lateral. IMPRESSION:  Oblique fracture of the distal fibula metadiaphysis with slight lateral subluxation of the ankle joint. Electronically Signed   By: VEAR Hurst M.D.   On: 02/01/2023 12:13    Procedures Procedures    Medications Ordered in ED Medications  oxyCODONE -acetaminophen  (PERCOCET/ROXICET) 5-325 MG per tablet 1 tablet (1 tablet Oral Given 02/01/23 1411)    ED Course/ Medical Decision Making/ A&P    Patient seen and examined. History obtained directly from patient. Work-up including labs, imaging, EKG ordered in triage, if performed, were reviewed.    Labs/EKG: None ordered  Imaging: Independently reviewed and interpreted.  This included: X-ray of the ankle, agree fibular fracture noted.  Medications/Fluids: P.o. Percocet  Most recent vital signs reviewed and are as follows: BP (!) 141/94 (BP Location: Left Arm)   Pulse 96   Temp (!) 96.3 F (35.7 C)   Resp 20   Wt 108.9 kg   SpO2 97%   BMI 37.59 kg/m   Initial impression: Distal fibula fracture  Home treatment plan: Will provide with crutches and Cam walker.  P.o. Percocet x 10 tablets given for home, otherwise Tylenol /NSAIDs recommended.  Return instructions discussed with patient: New or worsening symptoms  Follow-up instructions discussed with patient: Orthopedics in 5 days                                 Medical Decision Making Amount and/or Complexity of Data Reviewed Radiology: ordered.  Risk Prescription drug management.   Patient with mechanical fall yesterday on ice, twisted his ankle.  No knee injury or hip injury suspected.  Did not hit his head.  He has a distal fibula fracture.  Distal circulation, motor, and sensation intact.  No indication for emergent orthopedic consultation.  Pain control, RICE protocol, cam walker/crutches indicated at this time and provided.         Final Clinical Impression(s) / ED Diagnoses Final diagnoses:  Closed displaced oblique fracture of shaft of right fibula, initial  encounter    Rx / DC Orders ED Discharge Orders          Ordered    oxyCODONE -acetaminophen  (PERCOCET/ROXICET) 5-325 MG tablet  Every 6 hours PRN        02/01/23 1418              Desiderio Chew, PA-C 02/01/23 1421    Zackowski, Scott, MD 02/01/23 [phone]

## 2023-02-01 NOTE — Discharge Instructions (Signed)
 Please read and follow all provided instructions.  Your diagnoses today include:  1. Closed displaced oblique fracture of shaft of right fibula, initial encounter     Tests performed today include: An x-ray of the affected area - shows broken fibula Vital signs. See below for your results today.   Medications prescribed:  Percocet (oxycodone /acetaminophen ) - narcotic pain medication  DO NOT drive or perform any activities that require you to be awake and alert because this medicine can make you drowsy. BE VERY CAREFUL not to take multiple medicines containing Tylenol  (also called acetaminophen ). Doing so can lead to an overdose which can damage your liver and cause liver failure and possibly death.  Take any prescribed medications only as directed.  Home care instructions:  Follow any educational materials contained in this packet Follow R.I.C.E. Protocol: R - rest your injury  I  - use ice on injury without applying directly to skin C - compress injury with bandage or splint E - elevate the injury as much as possible  Follow-up instructions: Please follow-up with the orthopedic provider next week.   Return instructions:  Please return if your toes or feet are numb or tingling, appear gray or blue, or you have severe pain (also elevate the leg and loosen splint or wrap if you were given one) Please return to the Emergency Department if you experience worsening symptoms.  Please return if you have any other emergent concerns.  Additional Information:  Your vital signs today were: BP (!) 141/94 (BP Location: Left Arm)   Pulse 96   Temp (!) 96.3 F (35.7 C)   Resp 20   Wt 108.9 kg   SpO2 97%   BMI 37.59 kg/m  If your blood pressure (BP) was elevated above 135/85 this visit, please have this repeated by your doctor within one month. --------------

## 2023-02-03 ENCOUNTER — Ambulatory Visit (INDEPENDENT_AMBULATORY_CARE_PROVIDER_SITE_OTHER): Payer: PRIVATE HEALTH INSURANCE | Admitting: Podiatry

## 2023-02-03 ENCOUNTER — Encounter: Payer: Self-pay | Admitting: Podiatry

## 2023-02-03 DIAGNOSIS — R600 Localized edema: Secondary | ICD-10-CM

## 2023-02-03 DIAGNOSIS — S82891A Other fracture of right lower leg, initial encounter for closed fracture: Secondary | ICD-10-CM | POA: Diagnosis not present

## 2023-02-03 DIAGNOSIS — R262 Difficulty in walking, not elsewhere classified: Secondary | ICD-10-CM

## 2023-02-03 DIAGNOSIS — W010XXA Fall on same level from slipping, tripping and stumbling without subsequent striking against object, initial encounter: Secondary | ICD-10-CM

## 2023-02-03 NOTE — Progress Notes (Signed)
 Chief Complaint  Patient presents with   Foot Injury    Ankle right - slipped and fell on ice Saturday, 01/31/23, went to ED next day-xrayed-fractured fibula, put in boot and knee scooter, rx'd pain meds, sent here for follow/continued care, still swollen and sore   New Patient (Initial Visit)    Diabetic - last A1c was 10.4   HPI: 58 y.o. male presents today after being seen in the emergency department on 02/01/2023.  He fell and slipped on ice on Saturday and was diagnosed with a fibular fracture of the right ankle at that time.  He was placed in a short pneumatic cam walker and was using a knee scooter.  He was given a prescription medication for pain.  He was instructed follow-up in our office for continued care.  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  He notes that he needs to continue working and hopes to be able to use the knee scooter to get around.  He notes his pain is starting to improve since he has been immobilized.  He has not been wearing the cam walker for sleeping.  Past Medical History:  Diagnosis Date   Diabetes mellitus without complication (HCC)    Heart murmur    Hypertension     No past surgical history on file.  No Known Allergies   Physical Exam: There are palpable pedal pulses to the right foot.  There is moderate edema and ecchymosis mostly along the lateral aspect of the right ankle and lateral rear foot.  There is pain on palpation of the distal right fibula.  There is no pain on palpation of the proximal/superior portion of the fibula.  No pain on palpation of the medial ankle or Achilles tendon.  No pain on palpation of the plantar heel.  Range of motion and manual muscle testing was deferred secondary to pain.  Radiographic Exam (right ankle 3 non-weightbearing views from ED on 01/31/2023):  Normal osseous mineralization.  There is an oblique/spiral fibular fracture on the right ankle extending from the  ankle joint and extending laterally and superiorly.  This is in anatomic position.  No fracture noted of the medial malleolus or ankle mortise.  There is a moderate sized inferior calcaneal spur present.  Assessment/Plan of Care: 1. Closed fracture of right ankle, initial encounter   2. Fall on same level from slipping as cause of accidental injury     Discussed clinical and radiographic findings with patient today.  Since patient noted the short cam walker has been more uncomfortable causing irritation to the ankle and shin, he would be best served with a high pneumatic cam walker.  This is more appropriate given the orientation of his fracture.  He was fitted for a high pneumatic cam walker today and shown how to use this.  He will wear this at all times even when sleeping.  I recommend he not remove this for bathing and to either purchase a shower bag or sponge bathe until follow-up.  Will have him follow-up in 4 weeks.  Informed the patient that we could place him in a fiberglass nonweightbearing cast if he feels that he cannot stay compliant with the cam walker.  He would rather try the cam walker and will follow-up at that time.  Continue with ice,compression,elevation for the next 4 weeks.  He prefers to continue working, and plans to use the knee scooter.  Informed the patient if there is any displacement or shifting of the fracture fragment he will need to have surgical intervention/ORIF of the fracture.   Awanda CHARM Imperial, DPM, FACFAS [facility]     [address] [place], [place] [address]                Office [phone]  Fax [phone]

## 2023-03-03 ENCOUNTER — Ambulatory Visit (INDEPENDENT_AMBULATORY_CARE_PROVIDER_SITE_OTHER): Payer: PRIVATE HEALTH INSURANCE | Admitting: Podiatry

## 2023-03-03 ENCOUNTER — Ambulatory Visit (INDEPENDENT_AMBULATORY_CARE_PROVIDER_SITE_OTHER): Payer: PRIVATE HEALTH INSURANCE

## 2023-03-03 DIAGNOSIS — S82891G Other fracture of right lower leg, subsequent encounter for closed fracture with delayed healing: Secondary | ICD-10-CM

## 2023-03-03 DIAGNOSIS — R262 Difficulty in walking, not elsewhere classified: Secondary | ICD-10-CM

## 2023-03-03 DIAGNOSIS — S82891A Other fracture of right lower leg, initial encounter for closed fracture: Secondary | ICD-10-CM

## 2023-03-03 DIAGNOSIS — R600 Localized edema: Secondary | ICD-10-CM

## 2023-03-03 NOTE — Progress Notes (Unsigned)
Chief Complaint  Patient presents with   Fracture    recheck right ankle fracture 4 wks    HPI: 58 y.o. male presents today for follow-up of right fibular fracture.  His friend who is with him today, who is an occupational therapist, noted that the patient has not been using the cam walker at all times and staying off the foot as much as was recommended.  He does have the knee scooter today and states that he tries to use this is much as he can at home.  He still has some pain to the area and swelling.  He notes that the boot has been quite uncomfortable and he has not been able to get the air bladder to release the area with the release button.  Therefore the boot has not been working properly and sometimes will feel too tight since he cannot deflate the bladder.  Past Medical History:  Diagnosis Date   Diabetes mellitus without complication (HCC)    Heart murmur    Hypertension     No past surgical history on file.  No Known Allergies   Physical Exam: On exam there are palpable pedal pulses to the right foot.  There is localized edema throughout the lateral aspect of the ankle and rear foot area.  No ecchymosis is noted.  No pain on palpation along the medial aspect of the ankle or the deltoid ligament.  No calcaneal pain on palpation.  No Achilles pain on palpation.  There is pain on palpation along the fibula at the fracture location.  No pain on palpation to the fifth metatarsal base.  Radiographic Exam (right ankle, 3 nonweightbearing views, 03/03/2023) Normal osseous mineralization.  There appears to be some lateral displacement of the distal fracture fragment of the right fibula.  This does appear slightly larger gapping since the initial x-rays.  There is concern for instability at the ankle joint.  No evidence of bone callus formation is noted. Fracture line still easily evident on x-ray.  This is intra-articular  Assessment/Plan of Care: 1. Closed fracture of right  ankle, initial encounter     Discussed clinical and radiographic findings with patient today.  Informed the patient that there is concerned of the slight gapping of the distal portion of the fibula from the lateral talus.  Concerned about integrity of the ankle joint at this time.  Informed patient would like to have one of our ankle surgeons to take a look and see if they feel he needs to have ORIF of the right ankle fracture in the near future so as to put the bone in better position for optimal healing and also try to help decrease the long-term chances of arthritis down the road.  He was amenable to this.  Due to the previous pneumatic cam walker not working effectively and actually causing more discomfort to the patient, he was fitted for a new hightop pneumatic cam walker for the right lower extremity.  The medical assistant did fit him properly and make sure the bladder properly inflated and deflated prior to the patient leaving today he was also given the extra pads that he can use to protect any bony prominences if the boot feels like it is sticking in any specific location.  Continue with minimal to no weightbearing on the right foot.  Continuance of knee scooter recommended.  Follow-up within the next few days with one of our ankle surgeons.   Clerance Lav, DPM,  FACFAS [facility]     [address] [place], [place] [address]                Office [phone]  Fax [phone]

## 2023-03-04 ENCOUNTER — Ambulatory Visit (INDEPENDENT_AMBULATORY_CARE_PROVIDER_SITE_OTHER): Payer: PRIVATE HEALTH INSURANCE | Admitting: Podiatry

## 2023-03-04 DIAGNOSIS — S82891A Other fracture of right lower leg, initial encounter for closed fracture: Secondary | ICD-10-CM

## 2023-03-04 DIAGNOSIS — Z01818 Encounter for other preprocedural examination: Secondary | ICD-10-CM | POA: Diagnosis not present

## 2023-03-04 NOTE — Progress Notes (Signed)
Subjective:  Patient ID: Clifford Mason, male    DOB: [birth year],  MRN: [id number]  Chief Complaint  Patient presents with   Fracture    Pt stated that he was told he needed to see someone who does surgery     58 y.o. male presents with the above complaint.  Patient presents for right ankle fracture.  Patient was being treated conservatively by Dr. Bonna Gains.  However as he has been weightbearing on the ankle fracture there appears to be more gapping noted.  I discussed with the patient given the findings of gapping patient will benefit from surgical intervention as he has failed conservative care.  He is a diabetic with unknown A1c.   Review of Systems: Negative except as noted in the HPI. Denies N/V/F/Ch.  Past Medical History:  Diagnosis Date   Diabetes mellitus without complication (HCC)    Heart murmur    Hypertension     Current Outpatient Medications:    blood glucose meter kit and supplies KIT, Dispense based on patient and insurance preference. Use up to four times daily as directed., Disp: 1 each, Rfl: 0   diltiazem (CARDIZEM CD) 240 MG 24 hr capsule, Take 1 capsule (240 mg total) by mouth daily., Disp: 90 capsule, Rfl: 3   glipiZIDE (GLUCOTROL) 5 MG tablet, Take 1 tablet (5 mg total) by mouth daily before breakfast., Disp: 90 tablet, Rfl: 3   losartan (COZAAR) 50 MG tablet, Take 1 tablet (50 mg total) by mouth daily., Disp: 90 tablet, Rfl: 3   metFORMIN (GLUCOPHAGE) 1000 MG tablet, Take 1 tablet (1,000 mg total) by mouth 2 (two) times daily with a meal., Disp: 180 tablet, Rfl: 3   oxyCODONE-acetaminophen (PERCOCET/ROXICET) 5-325 MG tablet, Take 1 tablet by mouth every 6 (six) hours as needed for severe pain (pain score 7-10)., Disp: 10 tablet, Rfl: 0   sertraline (ZOLOFT) 50 MG tablet, Take 1 tablet (50 mg total) by mouth daily., Disp: 90 tablet, Rfl: 1  Social History   Tobacco Use  Smoking Status Never  Smokeless Tobacco Never    No Known Allergies Objective:   There were no vitals filed for this visit. There is no height or weight on file to calculate BMI. Constitutional Well developed. Well nourished.  Vascular Dorsalis pedis pulses palpable bilaterally. Posterior tibial pulses palpable bilaterally. Capillary refill normal to all digits.  No cyanosis or clubbing noted. Pedal hair growth normal.  Neurologic Normal speech. Oriented to person, place, and time. Epicritic sensation to light touch grossly present bilaterally.  Dermatologic Nails well groomed and normal in appearance. No open wounds. No skin lesions.  Orthopedic: Pain on palpation of the right fibula no open wounds or lesion noted.  Mild ecchymosis noted.  Pain with range of motion of the ankle joint.  No dislocation noted.  No skin tenting noted.   Radiographs: 3 views of skeletally mature adult noted.  Increasing gapping noted at the fracture site of the fibula from previous x-rays.  No increasing medial clear space noted.  No other bony abnormalities identified.  No other fracture spots noted Assessment:   1. Closed fracture of right ankle, initial encounter    Plan:  Patient was evaluated and treated and all questions answered.  Right ankle fracture with fibular involvement -All questions and concerns were discussed with the patient in extensive detail -Patient will benefit from open reduction internal fixation of the fibula fracture with possible ability of syndesmotic reduction however given that patient has failed conservative care with weightbearing  as tolerated cam boot patient will need a surgical close reduction of the fracture to give stability in the ankle joint.  However I am concerned for his A1c and vitamin D.  I discussed with the patient that his A1c needs to be below 8%.  If it is greater than 8% we will likely need to do intramedullary rod with minimal incision for fixation.  I discussed with patient he states understanding. -Given serum vitamin D and A1c was  ordered -Informed surgical risk consent was reviewed and read aloud to the patient.  I reviewed the films.  I have discussed my findings with the patient in great detail.  I have discussed all risks including but not limited to infection, stiffness, scarring, limp, disability, deformity, damage to blood vessels and nerves, numbness, poor healing, need for braces, arthritis, chronic pain, amputation, death.  All benefits and realistic expectations discussed in great detail.  I have made no promises as to the outcome.  I have provided realistic expectations.  I have offered the patient a 2nd opinion, which they have declined and assured me they preferred to proceed despite the risks   No follow-ups on file.

## 2023-03-05 ENCOUNTER — Telehealth: Payer: Self-pay | Admitting: Podiatry

## 2023-03-05 NOTE — Telephone Encounter (Signed)
DOS-03/23/23  OPEN REPAIR FRACTURED RT ANKLE- 27792 OPEN REPAIR ANKLE JOINT [id number]  ALLIED EFFECTIVE DATE- 01/22/23  SPOKE WITH SHAN C FROM ALLIED AND SHE STATED THAT PRIOR AUTH IS NOT REQUIRED FOR CPT CODES [id number] AND [phone].  CALL REF #: SHAN C 03/05/23 @ 10:29 AM EST

## 2023-03-23 ENCOUNTER — Other Ambulatory Visit: Payer: Self-pay | Admitting: Podiatry

## 2023-03-23 ENCOUNTER — Telehealth: Payer: Self-pay

## 2023-03-23 DIAGNOSIS — S82891A Other fracture of right lower leg, initial encounter for closed fracture: Secondary | ICD-10-CM | POA: Diagnosis not present

## 2023-03-23 MED ORDER — OXYCODONE-ACETAMINOPHEN 5-325 MG PO TABS: 1.0000 | ORAL_TABLET | ORAL | 0 refills | Status: DC | PRN

## 2023-03-23 MED ORDER — IBUPROFEN 800 MG PO TABS: 800.0000 mg | ORAL_TABLET | Freq: Four times a day (QID) | ORAL | 1 refills | Status: DC | PRN

## 2023-03-23 NOTE — Telephone Encounter (Signed)
 A woman called, she was driving the patient after surgery. Oxy and Ibuprofen were sent to the wrong pharmacy  - correct place is [facility] [address] [place] - this person was rude and snippy in her message -no indication who this woman is, she did not leave her name or phone number and no females listed on a release of information  Please send medications to correct pharmacy - added to patient's chart

## 2023-03-24 ENCOUNTER — Other Ambulatory Visit: Payer: Self-pay | Admitting: Podiatry

## 2023-03-24 ENCOUNTER — Telehealth: Payer: Self-pay | Admitting: Podiatry

## 2023-03-24 MED ORDER — OXYCODONE-ACETAMINOPHEN 5-325 MG PO TABS: 1.0000 | ORAL_TABLET | ORAL | 0 refills | Status: DC | PRN

## 2023-03-24 MED ORDER — IBUPROFEN 800 MG PO TABS: 800.0000 mg | ORAL_TABLET | Freq: Four times a day (QID) | ORAL | 1 refills | Status: AC | PRN

## 2023-03-24 NOTE — Telephone Encounter (Signed)
 Pt. Called around 2:15pm stating that he is not getting a call back from [place] regarding prescription. Also stated that he got a text message(?) from insurance stating they need provider to send authorization or new prescription. I relayed the message from [facility], that at 10am it was sent to the correct pharmacy. He could not determination if message he got was Brewing technologist. Advised to call pharmacy and if they still do not have to call us back.

## 2023-03-24 NOTE — Telephone Encounter (Signed)
 Called pt to let him know prior auth was received for medication and that pharmacy should have medication ready for pick up.  Pt stated his mom went up to pharmacy to get it and thank you for calling him back.

## 2023-03-24 NOTE — Telephone Encounter (Signed)
 PA was approved 03/24/23-04/23/23

## 2023-03-24 NOTE — Telephone Encounter (Signed)
 Pt called and the medication was sent to the wrong pharmacy yesterday after he had surgery. It has now been sent to the correct pharmacy but pt states the pharmacy is not filling it. Please call pt and or pharmacy to see what is going on. [facility] [place] [facility].  Pt is having some pain from the surgery.

## 2023-04-01 ENCOUNTER — Ambulatory Visit (INDEPENDENT_AMBULATORY_CARE_PROVIDER_SITE_OTHER): Payer: PRIVATE HEALTH INSURANCE

## 2023-04-01 ENCOUNTER — Telehealth: Payer: Self-pay | Admitting: Podiatry

## 2023-04-01 ENCOUNTER — Ambulatory Visit (INDEPENDENT_AMBULATORY_CARE_PROVIDER_SITE_OTHER): Payer: PRIVATE HEALTH INSURANCE | Admitting: Podiatry

## 2023-04-01 DIAGNOSIS — Z0271 Encounter for disability determination: Secondary | ICD-10-CM

## 2023-04-01 DIAGNOSIS — S82891A Other fracture of right lower leg, initial encounter for closed fracture: Secondary | ICD-10-CM

## 2023-04-01 DIAGNOSIS — Z9889 Other specified postprocedural states: Secondary | ICD-10-CM

## 2023-04-01 MED ORDER — GABAPENTIN 300 MG PO CAPS: 300.0000 mg | ORAL_CAPSULE | Freq: Three times a day (TID) | ORAL | 3 refills | Status: DC

## 2023-04-01 MED ORDER — OXYCODONE-ACETAMINOPHEN 5-325 MG PO TABS: 1.0000 | ORAL_TABLET | ORAL | 0 refills | Status: DC | PRN

## 2023-04-01 NOTE — Telephone Encounter (Signed)
 Faxed [facility] STD/NOTES to [phone]

## 2023-04-01 NOTE — Progress Notes (Signed)
 Subjective:  Patient ID: Clifford Mason, male    DOB: Aug 30, 1965,  MRN: [id number]  Chief Complaint  Patient presents with   Routine Post Op    1 DOS 03/23/23 --- RIGHT ANKLE ORIF WITH FIXATION AND SYNDESMOTIC REDUCTION/ REPAIR VERSUS POSSIBLE INTRAMEDULLARY ROD  Pt stated that he is doing okay he stated that last night was the worse pain so far he denies any fever chills nausea or vomiting     DOS: 03/23/2023 Procedure: Right ankle ORIF with syndesmotic fixation  58 y.o. male returns for post-op check.  Patient states that he is doing well denies any other acute complaints.  Bandages clean dry and intact.  Nonweightbearing to the right lower extremity with knee scooter  Review of Systems: Negative except as noted in the HPI. Denies N/V/F/Ch.  Past Medical History:  Diagnosis Date   Diabetes mellitus without complication (HCC)    Heart murmur    Hypertension     Current Outpatient Medications:    gabapentin (NEURONTIN) 300 MG capsule, Take 1 capsule (300 mg total) by mouth 3 (three) times daily., Disp: 90 capsule, Rfl: 3   oxyCODONE-acetaminophen (PERCOCET) 5-325 MG tablet, Take 1 tablet by mouth every 4 (four) hours as needed for severe pain (pain score 7-10)., Disp: 30 tablet, Rfl: 0   blood glucose meter kit and supplies KIT, Dispense based on patient and insurance preference. Use up to four times daily as directed., Disp: 1 each, Rfl: 0   diltiazem (CARDIZEM CD) 240 MG 24 hr capsule, Take 1 capsule (240 mg total) by mouth daily., Disp: 90 capsule, Rfl: 3   glipiZIDE (GLUCOTROL) 5 MG tablet, Take 1 tablet (5 mg total) by mouth daily before breakfast., Disp: 90 tablet, Rfl: 3   ibuprofen (ADVIL) 800 MG tablet, Take 1 tablet (800 mg total) by mouth every 6 (six) hours as needed., Disp: 60 tablet, Rfl: 1   ibuprofen (ADVIL) 800 MG tablet, Take 1 tablet (800 mg total) by mouth every 6 (six) hours as needed., Disp: 60 tablet, Rfl: 1   losartan (COZAAR) 50 MG tablet, Take 1 tablet (50  mg total) by mouth daily., Disp: 90 tablet, Rfl: 3   metFORMIN (GLUCOPHAGE) 1000 MG tablet, Take 1 tablet (1,000 mg total) by mouth 2 (two) times daily with a meal., Disp: 180 tablet, Rfl: 3   oxyCODONE-acetaminophen (PERCOCET) 5-325 MG tablet, Take 1 tablet by mouth every 4 (four) hours as needed for severe pain (pain score 7-10)., Disp: 30 tablet, Rfl: 0   oxyCODONE-acetaminophen (PERCOCET) 5-325 MG tablet, Take 1 tablet by mouth every 4 (four) hours as needed for severe pain (pain score 7-10)., Disp: 30 tablet, Rfl: 0   oxyCODONE-acetaminophen (PERCOCET/ROXICET) 5-325 MG tablet, Take 1 tablet by mouth every 6 (six) hours as needed for severe pain (pain score 7-10)., Disp: 10 tablet, Rfl: 0   sertraline (ZOLOFT) 50 MG tablet, Take 1 tablet (50 mg total) by mouth daily., Disp: 90 tablet, Rfl: 1  Social History   Tobacco Use  Smoking Status Never  Smokeless Tobacco Never    No Known Allergies Objective:  There were no vitals filed for this visit. There is no height or weight on file to calculate BMI. Constitutional Well developed. Well nourished.  Vascular Foot warm and well perfused. Capillary refill normal to all digits.   Neurologic Normal speech. Oriented to person, place, and time. Epicritic sensation to light touch grossly present bilaterally.  Dermatologic Skin healing well without signs of infection. Skin edges well coapted without signs  of infection.  Orthopedic: Tenderness to palpation noted about the surgical site.   Radiographs: 3 views of skeletally mature the right ankle: Good alignment noted.  Hardware is intact no signs of backing or loosening noted. Assessment:   1. Closed fracture of right ankle, initial encounter    Plan:  Patient was evaluated and treated and all questions answered.  S/p foot surgery right -Progressing as expected post-operatively. -XR: See above -WB Status: Nonweightbearing in right lower extremity in knee scooter -Sutures: Intact.  No  clinical signs of dehiscence noted no complication noted. -Medications: None -Foot redressed.  No follow-ups on file.

## 2023-04-01 NOTE — Telephone Encounter (Signed)
 Patient is requesting a letter stating he is released to go back to work.

## 2023-04-01 NOTE — Telephone Encounter (Signed)
 Patient is requesting a letter to release patient to return to work. Please fax to, Attn: Francine, Fax# [phone]/Patient's telephone number, [phone]

## 2023-04-02 ENCOUNTER — Encounter: Payer: Self-pay | Admitting: Podiatry

## 2023-04-02 NOTE — Telephone Encounter (Signed)
 Called pt at [phone] to adv I had faxed Francine [phone] and he said she had gotten. See prev notes.

## 2023-04-15 ENCOUNTER — Ambulatory Visit (INDEPENDENT_AMBULATORY_CARE_PROVIDER_SITE_OTHER): Payer: PRIVATE HEALTH INSURANCE | Admitting: Podiatry

## 2023-04-15 DIAGNOSIS — Z9889 Other specified postprocedural states: Secondary | ICD-10-CM

## 2023-04-15 DIAGNOSIS — S82891A Other fracture of right lower leg, initial encounter for closed fracture: Secondary | ICD-10-CM

## 2023-04-15 NOTE — Progress Notes (Signed)
 Subjective:  Patient ID: Clifford Mason, male    DOB: 1965/11/26,  MRN: [id number]  Chief Complaint  Patient presents with   Routine Post Op    DOS 03/23/23 --- RIGHT ANKLE ORIF WITH FIXATION AND SYNDESMOTIC REDUCTION/ REPAIR VERSUS POSSIBLE INTRAMEDULLARY ROD    DOS: 03/23/2023 Procedure: Right ankle ORIF with syndesmotic fixation  58 y.o. male returns for post-op check.  Patient states that he is doing well denies any other acute complaints.  Bandages clean dry and intact.  Nonweightbearing to the right lower extremity with knee scooter  Review of Systems: Negative except as noted in the HPI. Denies N/V/F/Ch.  Past Medical History:  Diagnosis Date   Diabetes mellitus without complication (HCC)    Heart murmur    Hypertension     Current Outpatient Medications:    blood glucose meter kit and supplies KIT, Dispense based on patient and insurance preference. Use up to four times daily as directed., Disp: 1 each, Rfl: 0   diltiazem (CARDIZEM CD) 240 MG 24 hr capsule, Take 1 capsule (240 mg total) by mouth daily., Disp: 90 capsule, Rfl: 3   gabapentin (NEURONTIN) 300 MG capsule, Take 1 capsule (300 mg total) by mouth 3 (three) times daily., Disp: 90 capsule, Rfl: 3   glipiZIDE (GLUCOTROL) 5 MG tablet, Take 1 tablet (5 mg total) by mouth daily before breakfast., Disp: 90 tablet, Rfl: 3   ibuprofen (ADVIL) 800 MG tablet, Take 1 tablet (800 mg total) by mouth every 6 (six) hours as needed., Disp: 60 tablet, Rfl: 1   ibuprofen (ADVIL) 800 MG tablet, Take 1 tablet (800 mg total) by mouth every 6 (six) hours as needed., Disp: 60 tablet, Rfl: 1   losartan (COZAAR) 50 MG tablet, Take 1 tablet (50 mg total) by mouth daily., Disp: 90 tablet, Rfl: 3   metFORMIN (GLUCOPHAGE) 1000 MG tablet, Take 1 tablet (1,000 mg total) by mouth 2 (two) times daily with a meal., Disp: 180 tablet, Rfl: 3   oxyCODONE-acetaminophen (PERCOCET) 5-325 MG tablet, Take 1 tablet by mouth every 4 (four) hours as needed for  severe pain (pain score 7-10)., Disp: 30 tablet, Rfl: 0   oxyCODONE-acetaminophen (PERCOCET) 5-325 MG tablet, Take 1 tablet by mouth every 4 (four) hours as needed for severe pain (pain score 7-10)., Disp: 30 tablet, Rfl: 0   oxyCODONE-acetaminophen (PERCOCET) 5-325 MG tablet, Take 1 tablet by mouth every 4 (four) hours as needed for severe pain (pain score 7-10)., Disp: 30 tablet, Rfl: 0   oxyCODONE-acetaminophen (PERCOCET/ROXICET) 5-325 MG tablet, Take 1 tablet by mouth every 6 (six) hours as needed for severe pain (pain score 7-10)., Disp: 10 tablet, Rfl: 0   sertraline (ZOLOFT) 50 MG tablet, Take 1 tablet (50 mg total) by mouth daily., Disp: 90 tablet, Rfl: 1  Social History   Tobacco Use  Smoking Status Never  Smokeless Tobacco Never    No Known Allergies Objective:  There were no vitals filed for this visit. There is no height or weight on file to calculate BMI. Constitutional Well developed. Well nourished.  Vascular Foot warm and well perfused. Capillary refill normal to all digits.   Neurologic Normal speech. Oriented to person, place, and time. Epicritic sensation to light touch grossly present bilaterally.  Dermatologic Skin completely epithelialized no signs of Deis is noted no complication noted good range of motion of the ankle joint noted 3 degrees past dorsiflexion noted.  Orthopedic: Tenderness to palpation noted about the surgical site.   Radiographs: 3 views of  skeletally mature the right ankle: Good alignment noted.  Hardware is intact no signs of backing or loosening noted. Assessment:   No diagnosis found.  Plan:  Patient was evaluated and treated and all questions answered.  S/p foot surgery right -Progressing as expected post-operatively. -XR: See above -WB Status: Begin weightbearing as tolerated in cam boot slowly and gradually -Sutures: Removed no clinical signs of dehiscence noted no complication noted. -Medications: None -I discussed if he is  having hard time ambulating we can discuss physical therapy at this time he will let me  No follow-ups on file.

## 2023-05-15 ENCOUNTER — Ambulatory Visit (INDEPENDENT_AMBULATORY_CARE_PROVIDER_SITE_OTHER): Payer: PRIVATE HEALTH INSURANCE

## 2023-05-15 ENCOUNTER — Ambulatory Visit: Payer: PRIVATE HEALTH INSURANCE | Admitting: Podiatry

## 2023-05-15 DIAGNOSIS — M722 Plantar fascial fibromatosis: Secondary | ICD-10-CM

## 2023-05-15 DIAGNOSIS — S82891A Other fracture of right lower leg, initial encounter for closed fracture: Secondary | ICD-10-CM

## 2023-05-15 DIAGNOSIS — Q666 Other congenital valgus deformities of feet: Secondary | ICD-10-CM

## 2023-05-15 NOTE — Progress Notes (Signed)
 Orthotics   Patient was present and evaluated for Custom molded foot orthotics. Patient will benefit from CFO's to provide total contact to BIL MLA's helping to balance and distribute body weight more evenly across BIL feet helping to reduce plantar pressure and pain. Orthotic will also encourage FF / RF alignment  Patient was scanned today and will return for fitting upon receipt

## 2023-05-15 NOTE — Progress Notes (Signed)
 Subjective:  Patient ID: Clifford Mason, male    DOB: 07-Sep-1965,  MRN: [id number]  Chief Complaint  Patient presents with   Routine Post Op    DOS 03/23/23 --- RIGHT ANKLE ORIF WITH FIXATION AND SYNDESMOTIC REDUCTION/ REPAIR VERSUS POSSIBLE INTRAMEDULLARY ROD Pt stated that he is doing well he does have some heel pain     58 y.o. male presents with the above complaint.  Patient presents with new complaint of right heel pain.  He states he is doing good from the ankle standpoint.  He would return to regular shoes no pain.  He would like to discuss the heel pain he came out of nowhere has been hurting pain scale 7 out of 10 dull aching nature has not seen MRIs prior to see me pains hurts with taking for step in the morning he is a diabetic.   Review of Systems: Negative except as noted in the HPI. Denies N/V/F/Ch.  Past Medical History:  Diagnosis Date   Diabetes mellitus without complication (HCC)    Heart murmur    Hypertension     Current Outpatient Medications:    blood glucose meter kit and supplies KIT, Dispense based on patient and insurance preference. Use up to four times daily as directed., Disp: 1 each, Rfl: 0   diltiazem  (CARDIZEM  CD) 240 MG 24 hr capsule, Take 1 capsule (240 mg total) by mouth daily., Disp: 90 capsule, Rfl: 3   gabapentin  (NEURONTIN ) 300 MG capsule, Take 1 capsule (300 mg total) by mouth 3 (three) times daily., Disp: 90 capsule, Rfl: 3   glipiZIDE  (GLUCOTROL ) 5 MG tablet, Take 1 tablet (5 mg total) by mouth daily before breakfast., Disp: 90 tablet, Rfl: 3   ibuprofen  (ADVIL ) 800 MG tablet, Take 1 tablet (800 mg total) by mouth every 6 (six) hours as needed., Disp: 60 tablet, Rfl: 1   ibuprofen  (ADVIL ) 800 MG tablet, Take 1 tablet (800 mg total) by mouth every 6 (six) hours as needed., Disp: 60 tablet, Rfl: 1   losartan  (COZAAR ) 50 MG tablet, Take 1 tablet (50 mg total) by mouth daily., Disp: 90 tablet, Rfl: 3   metFORMIN  (GLUCOPHAGE ) 1000 MG tablet, Take  1 tablet (1,000 mg total) by mouth 2 (two) times daily with a meal., Disp: 180 tablet, Rfl: 3   oxyCODONE -acetaminophen  (PERCOCET) 5-325 MG tablet, Take 1 tablet by mouth every 4 (four) hours as needed for severe pain (pain score 7-10)., Disp: 30 tablet, Rfl: 0   oxyCODONE -acetaminophen  (PERCOCET) 5-325 MG tablet, Take 1 tablet by mouth every 4 (four) hours as needed for severe pain (pain score 7-10)., Disp: 30 tablet, Rfl: 0   oxyCODONE -acetaminophen  (PERCOCET) 5-325 MG tablet, Take 1 tablet by mouth every 4 (four) hours as needed for severe pain (pain score 7-10)., Disp: 30 tablet, Rfl: 0   oxyCODONE -acetaminophen  (PERCOCET/ROXICET) 5-325 MG tablet, Take 1 tablet by mouth every 6 (six) hours as needed for severe pain (pain score 7-10)., Disp: 10 tablet, Rfl: 0   sertraline  (ZOLOFT ) 50 MG tablet, Take 1 tablet (50 mg total) by mouth daily., Disp: 90 tablet, Rfl: 1  Social History   Tobacco Use  Smoking Status Never  Smokeless Tobacco Never    No Known Allergies Objective:  There were no vitals filed for this visit. There is no height or weight on file to calculate BMI. Constitutional Well developed. Well nourished.  Vascular Dorsalis pedis pulses palpable bilaterally. Posterior tibial pulses palpable bilaterally. Capillary refill normal to all digits.  No cyanosis or  clubbing noted. Pedal hair growth normal.  Neurologic Normal speech. Oriented to person, place, and time. Epicritic sensation to light touch grossly present bilaterally.  Dermatologic Nails well groomed and normal in appearance. No open wounds. No skin lesions.  Orthopedic: Normal joint ROM without pain or crepitus bilaterally. No visible deformities. Tender to palpation at the calcaneal tuber right. No pain with calcaneal squeeze right. Ankle ROM diminished range of motion right. Silfverskiold Test: positive right.   Radiographs: 3 views of the right ankle: Hardware is intact no signs of backing or loosening  noted.  Consolidation noted.  1. Closed fracture of right ankle, initial encounter   2. Plantar fasciitis, right   3. Pes planovalgus    Plan:  Patient was evaluated and treated and all questions answered.  Right ankle fracture status post -clinically healed and officially discharged from my care for the ankle fracture he is already in his regular shoes without any restrictions without any discomfort  Plantar Fasciitis, right - XR reviewed as above.  - Educated on icing and stretching. Instructions given.  - Injection delivered to the plantar fascia as below. - DME: Plantar fascial brace dispensed to support the medial longitudinal arch of the foot and offload pressure from the heel and prevent arch collapse during weightbearing - Pharmacologic management: None  Pes planovalgus -I explained to patient the etiology of pes planovalgus and relationship with Planter fasciitis and various treatment options were discussed.  Given patient foot structure in the setting of Planter fasciitis I believe patient will benefit from custom-made orthotics to help control the hindfoot motion support the arch of the foot and take the stress away from plantar fascial.  Patient agrees with the plan like to proceed with orthotics -Patient was casted for orthotics   Procedure: Injection Tendon/Ligament Location: Right plantar fascia at the glabrous junction; medial approach. Skin Prep: alcohol Injectate: 0.5 cc 0.5% marcaine plain, 0.5 cc of 1% Lidocaine, 0.5 cc kenalog 10. Disposition: Patient tolerated procedure well. Injection site dressed with a band-aid.  No follow-ups on file.

## 2023-06-09 ENCOUNTER — Telehealth: Payer: Self-pay | Admitting: General Practice

## 2023-06-09 NOTE — Telephone Encounter (Signed)
 Copied from CRM [id number]. Topic: Appointments - Scheduling Inquiry for Clinic >> Jun 09, 2023 11:05 AM Mag[employer]ene [employer] wrote: Reason for CRM: Patient is calling to schedule an appointment for medication management with Dr. Sagardia. He stated that he was his patient at a different practice and would like to see him again.

## 2023-06-09 NOTE — Telephone Encounter (Signed)
 Negative.  Not taking new patients.  Thanks.

## 2023-06-23 ENCOUNTER — Ambulatory Visit: Payer: PRIVATE HEALTH INSURANCE

## 2023-06-23 NOTE — Progress Notes (Signed)
 Patient presents today to pick up custom molded foot orthotics, diagnosed with HX of fracture pes planus by Dr. Lydia Sams.   Orthotics were dispensed and fit was satisfactory. Reviewed instructions for break-in and wear. Written instructions given to patient.  Patient will follow up as needed.   Britton Cane Cped, CFo, CFm

## 2023-09-17 ENCOUNTER — Encounter: Payer: Self-pay | Admitting: Family Medicine

## 2023-09-17 ENCOUNTER — Ambulatory Visit (INDEPENDENT_AMBULATORY_CARE_PROVIDER_SITE_OTHER): Payer: PRIVATE HEALTH INSURANCE | Admitting: Family Medicine

## 2023-09-17 ENCOUNTER — Ambulatory Visit: Payer: Self-pay | Admitting: Family Medicine

## 2023-09-17 VITALS — BP 170/110 | HR 86 | Temp 98.5°F | Ht 67.0 in | Wt 244.4 lb

## 2023-09-17 DIAGNOSIS — I499 Cardiac arrhythmia, unspecified: Secondary | ICD-10-CM | POA: Diagnosis not present

## 2023-09-17 DIAGNOSIS — E559 Vitamin D deficiency, unspecified: Secondary | ICD-10-CM

## 2023-09-17 DIAGNOSIS — Z7984 Long term (current) use of oral hypoglycemic drugs: Secondary | ICD-10-CM

## 2023-09-17 DIAGNOSIS — F411 Generalized anxiety disorder: Secondary | ICD-10-CM | POA: Diagnosis not present

## 2023-09-17 DIAGNOSIS — E1165 Type 2 diabetes mellitus with hyperglycemia: Secondary | ICD-10-CM | POA: Diagnosis not present

## 2023-09-17 DIAGNOSIS — Z136 Encounter for screening for cardiovascular disorders: Secondary | ICD-10-CM | POA: Diagnosis not present

## 2023-09-17 DIAGNOSIS — Z0100 Encounter for examination of eyes and vision without abnormal findings: Secondary | ICD-10-CM

## 2023-09-17 DIAGNOSIS — Z79899 Other long term (current) drug therapy: Secondary | ICD-10-CM | POA: Diagnosis not present

## 2023-09-17 DIAGNOSIS — I10 Essential (primary) hypertension: Secondary | ICD-10-CM | POA: Diagnosis not present

## 2023-09-17 DIAGNOSIS — E119 Type 2 diabetes mellitus without complications: Secondary | ICD-10-CM | POA: Insufficient documentation

## 2023-09-17 DIAGNOSIS — E782 Mixed hyperlipidemia: Secondary | ICD-10-CM

## 2023-09-17 DIAGNOSIS — Z1211 Encounter for screening for malignant neoplasm of colon: Secondary | ICD-10-CM | POA: Insufficient documentation

## 2023-09-17 DIAGNOSIS — R519 Headache, unspecified: Secondary | ICD-10-CM | POA: Insufficient documentation

## 2023-09-17 LAB — CBC WITH DIFFERENTIAL/PLATELET
Basophils Absolute: 0.1 K/uL (ref 0.0–0.1)
Basophils Relative: 1.1 % (ref 0.0–3.0)
Eosinophils Absolute: 0.2 K/uL (ref 0.0–0.7)
Eosinophils Relative: 3.1 % (ref 0.0–5.0)
HCT: 51.4 % (ref 39.0–52.0)
Hemoglobin: 17.1 g/dL — ABNORMAL HIGH (ref 13.0–17.0)
Lymphocytes Relative: 30.6 % (ref 12.0–46.0)
Lymphs Abs: 1.6 K/uL (ref 0.7–4.0)
MCHC: 33.3 g/dL (ref 30.0–36.0)
MCV: 88.6 fl (ref 78.0–100.0)
Monocytes Absolute: 0.4 K/uL (ref 0.1–1.0)
Monocytes Relative: 7.2 % (ref 3.0–12.0)
Neutro Abs: 3.1 K/uL (ref 1.4–7.7)
Neutrophils Relative %: 58 % (ref 43.0–77.0)
Platelets: 212 K/uL (ref 150.0–400.0)
RBC: 5.8 Mil/uL (ref 4.22–5.81)
RDW: 13.3 % (ref 11.5–15.5)
WBC: 5.4 K/uL (ref 4.0–10.5)

## 2023-09-17 LAB — MICROALBUMIN / CREATININE URINE RATIO
Creatinine,U: 76.7 mg/dL
Microalb Creat Ratio: 46.8 mg/g — ABNORMAL HIGH (ref 0.0–30.0)
Microalb, Ur: 3.6 mg/dL — ABNORMAL HIGH (ref 0.0–1.9)

## 2023-09-17 LAB — COMPREHENSIVE METABOLIC PANEL WITH GFR
ALT: 34 U/L (ref 0–53)
AST: 17 U/L (ref 0–37)
Albumin: 4.5 g/dL (ref 3.5–5.2)
Alkaline Phosphatase: 61 U/L (ref 39–117)
BUN: 9 mg/dL (ref 6–23)
CO2: 24 meq/L (ref 19–32)
Calcium: 9.4 mg/dL (ref 8.4–10.5)
Chloride: 102 meq/L (ref 96–112)
Creatinine, Ser: 0.7 mg/dL (ref 0.40–1.50)
GFR: 102.13 mL/min (ref >60.00)
Glucose, Bld: 261 mg/dL — ABNORMAL HIGH (ref 70–99)
Potassium: 4.3 meq/L (ref 3.5–5.1)
Sodium: 138 meq/L (ref 135–145)
Total Bilirubin: 1.1 mg/dL (ref 0.2–1.2)
Total Protein: 6.8 g/dL (ref 6.0–8.3)

## 2023-09-17 LAB — VITAMIN D 25 HYDROXY (VIT D DEFICIENCY, FRACTURES): VITD: 12.13 ng/mL — ABNORMAL LOW (ref 30.00–100.00)

## 2023-09-17 LAB — LIPID PANEL
Cholesterol: 182 mg/dL (ref 0–200)
HDL: 48.8 mg/dL (ref >39.00)
LDL Cholesterol: 99 mg/dL (ref 0–99)
NonHDL: 132.85
Total CHOL/HDL Ratio: 4
Triglycerides: 168 mg/dL — ABNORMAL HIGH (ref 0.0–149.0)
VLDL: 33.6 mg/dL (ref 0.0–40.0)

## 2023-09-17 LAB — HEMOGLOBIN A1C: Hgb A1c MFr Bld: 11.6 % — ABNORMAL HIGH (ref 4.6–6.5)

## 2023-09-17 LAB — VITAMIN B12: Vitamin B-12: 269 pg/mL (ref 211–911)

## 2023-09-17 MED ORDER — VITAMIN D (ERGOCALCIFEROL) 1.25 MG (50000 UNIT) PO CAPS
50000.0000 [IU] | ORAL_CAPSULE | ORAL | 0 refills | Status: DC
Start: 2023-09-17 — End: 2023-11-12

## 2023-09-17 MED ORDER — LOSARTAN POTASSIUM 50 MG PO TABS
50.0000 mg | ORAL_TABLET | Freq: Every day | ORAL | 3 refills | Status: AC
Start: 2023-09-17 — End: 2023-12-16

## 2023-09-17 MED ORDER — TIRZEPATIDE 2.5 MG/0.5ML ~~LOC~~ SOAJ: 2.5000 mg | SUBCUTANEOUS | 0 refills | Status: DC

## 2023-09-17 MED ORDER — SERTRALINE HCL 25 MG PO TABS: 25.0000 mg | ORAL_TABLET | Freq: Every day | ORAL | 3 refills | Status: DC

## 2023-09-17 MED ORDER — ROSUVASTATIN CALCIUM 10 MG PO TABS: 10.0000 mg | ORAL_TABLET | Freq: Every day | ORAL | 1 refills | Status: AC

## 2023-09-17 MED ORDER — METFORMIN HCL 1000 MG PO TABS: 1000.0000 mg | ORAL_TABLET | Freq: Two times a day (BID) | ORAL | 3 refills | Status: DC

## 2023-09-17 MED ORDER — DILTIAZEM HCL ER COATED BEADS 240 MG PO CP24: 240.0000 mg | ORAL_CAPSULE | Freq: Every day | ORAL | 3 refills | Status: AC

## 2023-09-17 NOTE — Progress Notes (Signed)
 New Patient Visit  Subjective:     Patient ID: Clifford Mason, male    DOB: 08-19-1965, 58 y.o.   MRN: [id number]  No chief complaint on file.   HPI  Discussed the use of AI scribe software for clinical note transcription with the patient, who gave verbal consent to proceed.  History of Present Illness Clifford Mason is a 58 year old male with hypertension and diabetes who presents for medication management and evaluation of elevated blood pressure.  Hypertension - Elevated blood pressure noted on presentation. - Not currently taking antihypertensive medications. - Previously achieved good blood pressure control with losartan  and diltiazem  combined with healthy lifestyle. - Has not maintained healthy lifestyle recently. - No home blood pressure monitoring. - No palpitations or shortness of breath. - No history of irregular heartbeat.  Headache - Frequent headaches present. - No associated vision changes.  Visual disturbance - Difficulty with close-range vision, attributed to age. - No recent eye exam in approximately three years.  Diabetes mellitus - Not currently taking diabetes medication. - Previously tolerated metformin  well. - Interested in additional treatment options, especially those that may assist with weight management. - Chronic overweight status.     ROS Per HPI  Outpatient Encounter Medications as of 09/17/2023  Medication Sig   blood glucose meter kit and supplies KIT Dispense based on patient and insurance preference. Use up to four times daily as directed.   glipiZIDE  (GLUCOTROL ) 5 MG tablet Take 1 tablet (5 mg total) by mouth daily before breakfast.   ibuprofen  (ADVIL ) 800 MG tablet Take 1 tablet (800 mg total) by mouth every 6 (six) hours as needed.   sertraline  (ZOLOFT ) 25 MG tablet Take 1 tablet (25 mg total) by mouth daily.   [DISCONTINUED] diltiazem  (CARDIZEM  CD) 240 MG 24 hr capsule Take 1 capsule (240 mg total) by mouth daily.    [DISCONTINUED] ibuprofen  (ADVIL ) 800 MG tablet Take 1 tablet (800 mg total) by mouth every 6 (six) hours as needed.   [DISCONTINUED] losartan  (COZAAR ) 50 MG tablet Take 1 tablet (50 mg total) by mouth daily.   [DISCONTINUED] metFORMIN  (GLUCOPHAGE ) 1000 MG tablet Take 1 tablet (1,000 mg total) by mouth 2 (two) times daily with a meal.   diltiazem  (CARDIZEM  CD) 240 MG 24 hr capsule Take 1 capsule (240 mg total) by mouth daily.   losartan  (COZAAR ) 50 MG tablet Take 1 tablet (50 mg total) by mouth daily.   metFORMIN  (GLUCOPHAGE ) 1000 MG tablet Take 1 tablet (1,000 mg total) by mouth 2 (two) times daily with a meal.   [DISCONTINUED] gabapentin  (NEURONTIN ) 300 MG capsule Take 1 capsule (300 mg total) by mouth 3 (three) times daily. (Patient not taking: Reported on 09/17/2023)   [DISCONTINUED] oxyCODONE -acetaminophen  (PERCOCET) 5-325 MG tablet Take 1 tablet by mouth every 4 (four) hours as needed for severe pain (pain score 7-10). (Patient not taking: Reported on 09/17/2023)   [DISCONTINUED] oxyCODONE -acetaminophen  (PERCOCET) 5-325 MG tablet Take 1 tablet by mouth every 4 (four) hours as needed for severe pain (pain score 7-10). (Patient not taking: Reported on 09/17/2023)   [DISCONTINUED] oxyCODONE -acetaminophen  (PERCOCET) 5-325 MG tablet Take 1 tablet by mouth every 4 (four) hours as needed for severe pain (pain score 7-10). (Patient not taking: Reported on 09/17/2023)   [DISCONTINUED] oxyCODONE -acetaminophen  (PERCOCET/ROXICET) 5-325 MG tablet Take 1 tablet by mouth every 6 (six) hours as needed for severe pain (pain score 7-10). (Patient not taking: Reported on 09/17/2023)   [DISCONTINUED] sertraline  (ZOLOFT ) 50 MG tablet Take 1 tablet (  50 mg total) by mouth daily. (Patient not taking: Reported on 09/17/2023)   No facility-administered encounter medications on file as of 09/17/2023.    Past Medical History:  Diagnosis Date   Anxiety    Diabetes mellitus without complication (HCC)    GERD (gastroesophageal  reflux disease)    Heart murmur    Hypertension     Past Surgical History:  Procedure Laterality Date   FRACTURE SURGERY     Broken ankle    Family History  Problem Relation Age of Onset   Healthy Mother     Social History   Socioeconomic History   Marital status: Single    Spouse name: Not on file   Number of children: Not on file   Years of education: Not on file   Highest education level: Bachelor's degree (e.g., BA, AB, BS)  Occupational History   Occupation: Hotel  Tobacco Use   Smoking status: Former   Smokeless tobacco: Never  Substance and Sexual Activity   Alcohol use: Yes   Drug use: No   Sexual activity: Not on file  Other Topics Concern   Not on file  Social History Narrative   Works third shift at a hotel.      Drinks occasional soda 1-2 week.   Social Drivers of Health   Financial Resource Strain: Patient Declined (09/14/2023)   Overall Financial Resource Strain (CARDIA)    Difficulty of Paying Living Expenses: Patient declined  Food Insecurity: Patient Declined (09/14/2023)   Hunger Vital Sign    Worried About Running Out of Food in the Last Year: Patient declined    Ran Out of Food in the Last Year: Patient declined  Transportation Needs: No Transportation Needs (09/14/2023)   PRAPARE - Administrator, Civil Service (Medical): No    Lack of Transportation (Non-Medical): No  Physical Activity: Not on file  Stress: Not on file  Social Connections: Moderately Isolated (09/14/2023)   Social Connection and Isolation Panel    Frequency of Communication with Friends and Family: More than three times a week    Frequency of Social Gatherings with Friends and Family: Three times a week    Attends Religious Services: 1 to 4 times per year    Active Member of Clubs or Organizations: No    Attends Engineer, structural: Not on file    Marital Status: Never married  Intimate Partner Violence: Not on file       Objective:    BP (!)  170/110   Pulse 86   Temp 98.5 F (36.9 C) (Temporal)   Ht 5' 7 (1.702 m)   Wt 244 lb 6.4 oz (110.9 kg)   SpO2 96%   BMI 38.28 kg/m    Physical Exam Vitals and nursing note reviewed.  Constitutional:      General: He is not in acute distress.    Appearance: Normal appearance. He is obese.  HENT:     Head: Normocephalic and atraumatic.     Right Ear: External ear normal.     Left Ear: External ear normal.     Nose: Nose normal.     Mouth/Throat:     Mouth: Mucous membranes are moist.     Pharynx: Oropharynx is clear.  Eyes:     Extraocular Movements: Extraocular movements intact.  Cardiovascular:     Rate and Rhythm: Normal rate. Rhythm irregular.     Pulses: Normal pulses.     Heart sounds: Normal heart sounds.  Pulmonary:     Effort: Pulmonary effort is normal. No respiratory distress.     Breath sounds: Normal breath sounds. No wheezing, rhonchi or rales.  Musculoskeletal:        General: Normal range of motion.     Cervical back: Normal range of motion.     Right lower leg: No edema.     Left lower leg: No edema.  Lymphadenopathy:     Cervical: No cervical adenopathy.  Skin:    General: Skin is warm and dry.  Neurological:     General: No focal deficit present.     Mental Status: He is alert and oriented to person, place, and time.  Psychiatric:        Mood and Affect: Mood normal.        Behavior: Behavior normal.    EKG: Indication: irregular rhythm Rate: 86 Interpretation: sinus rhythm with PACs Changes from previous:   No results found for any visits on 09/17/23.      Assessment & Plan:   Assessment and Plan Assessment & Plan Essential hypertension Blood pressure elevated, managed with losartan  and diltiazem  effectively. - Refill losartan . - Refill diltiazem . - Perform EKG to evaluate irregular heartbeat, sinus rhythm with PACs  Type 2 diabetes mellitus Managed with metformin . A1c level unknown. Discussed injectable medications if A1c  high, considering side effects. No family history of thyroid cancer. - Refill metformin  twice daily. - Order labs to assess A1c. - Consider glipizide  based on A1c results. - Discuss potential use of injectable diabetes medication if A1c is high.  Major depressive disorder Previously on Zoloft , tolerated well. Plans to restart at lower dose to avoid side effects. - Prescribe Zoloft , starting with half a tablet for the first week, then increase to a full tablet daily.  Headaches Frequent headaches, no eye exam in three years, difficulty with close-range vision likely age-related. - Order labs to assess for underlying causes such as diabetes.  Irregular heartbeat Extra heartbeat noted, no palpitations or shortness of breath. Cardizem  may address irregular heartbeat. - Perform EKG to evaluate irregular heartbeat (interpreted by me) - Sinus Rhythm with PACs  Colon Cancer Screening - GI referral for colonoscopy screen  Screening for cardiovascular disorder - lipids today  BMI 38 - Discussed possible candidate for GLP-1 - Will address again once labs are received - Discussed healthy diet and acitivity level - Discusses side effects of GLP-1 - No personal or family hx medullary thyroid cancer or TEN  Follow-Up Follow-up needed to assess blood pressure, Zoloft  tolerance, and overall management. - Check BP at home once daily in the mornings for the next month. Follow up with me in a month to recheck blood pressures. - Schedule follow-up appointment in one month.     Orders Placed This Encounter  Procedures   CBC with Differential/Platelet    Release to patient:   Immediate [1]   Comprehensive metabolic panel with GFR    Release to patient:   Immediate [1]   Hemoglobin A1c   Lipid panel   Microalbumin / creatinine urine ratio    Release to patient:   Immediate   Vitamin B12   VITAMIN D  25 Hydroxy (Vit-D Deficiency, Fractures)   Ambulatory referral to Ophthalmology     Referral Priority:   Routine    Referral Type:   Consultation    Referral Reason:   Specialty Services Required    Requested Specialty:   Ophthalmology    Number of Visits Requested:   1  Ambulatory referral to Gastroenterology    Referral Priority:   Routine    Referral Type:   Consultation    Referral Reason:   Specialty Services Required    Number of Visits Requested:   1   EKG 12-Lead     Meds ordered this encounter  Medications   diltiazem  (CARDIZEM  CD) 240 MG 24 hr capsule    Sig: Take 1 capsule (240 mg total) by mouth daily.    Dispense:  90 capsule    Refill:  3   losartan  (COZAAR ) 50 MG tablet    Sig: Take 1 tablet (50 mg total) by mouth daily.    Dispense:  90 tablet    Refill:  3   sertraline  (ZOLOFT ) 25 MG tablet    Sig: Take 1 tablet (25 mg total) by mouth daily.    Dispense:  30 tablet    Refill:  3   metFORMIN  (GLUCOPHAGE ) 1000 MG tablet    Sig: Take 1 tablet (1,000 mg total) by mouth 2 (two) times daily with a meal.    Dispense:  180 tablet    Refill:  3    Return in about 4 weeks (around 10/15/2023) for meds, BP.  Corean LITTIE Ku, FNP

## 2023-09-17 NOTE — Patient Instructions (Signed)
 Welcome to [facility]!  Thank you for choosing us  for your Primary Care needs.   We offer in person and video appointments for your convenience. You may call our office to schedule appointments, or you may schedule appointments with me through MyChart.   The best way to get in contact with me is via MyChart message. This will get to me faster than a phone call, unless there is an emergency, then please call 911.  The lab is located downstairs in the Sports Medicine building, we also have xray available there.

## 2023-09-28 LAB — HM DIABETES EYE EXAM

## 2023-09-29 ENCOUNTER — Encounter: Payer: Self-pay | Admitting: Family Medicine

## 2023-10-08 ENCOUNTER — Other Ambulatory Visit: Payer: Self-pay | Admitting: Family Medicine

## 2023-10-08 DIAGNOSIS — E559 Vitamin D deficiency, unspecified: Secondary | ICD-10-CM

## 2023-10-10 ENCOUNTER — Other Ambulatory Visit: Payer: Self-pay | Admitting: Family Medicine

## 2023-10-10 DIAGNOSIS — Z79899 Other long term (current) drug therapy: Secondary | ICD-10-CM

## 2023-10-10 DIAGNOSIS — F411 Generalized anxiety disorder: Secondary | ICD-10-CM

## 2023-10-20 ENCOUNTER — Ambulatory Visit: Payer: PRIVATE HEALTH INSURANCE | Admitting: Family Medicine

## 2023-10-20 ENCOUNTER — Encounter: Payer: Self-pay | Admitting: Family Medicine

## 2023-10-20 VITALS — BP 122/76 | HR 73 | Temp 97.6°F | Ht 67.0 in | Wt 228.8 lb

## 2023-10-20 DIAGNOSIS — E1165 Type 2 diabetes mellitus with hyperglycemia: Secondary | ICD-10-CM

## 2023-10-20 DIAGNOSIS — I10 Essential (primary) hypertension: Secondary | ICD-10-CM | POA: Diagnosis not present

## 2023-10-20 DIAGNOSIS — Z23 Encounter for immunization: Secondary | ICD-10-CM | POA: Diagnosis not present

## 2023-10-20 DIAGNOSIS — F331 Major depressive disorder, recurrent, moderate: Secondary | ICD-10-CM

## 2023-10-20 DIAGNOSIS — F411 Generalized anxiety disorder: Secondary | ICD-10-CM

## 2023-10-20 DIAGNOSIS — Z79899 Other long term (current) drug therapy: Secondary | ICD-10-CM

## 2023-10-20 DIAGNOSIS — E559 Vitamin D deficiency, unspecified: Secondary | ICD-10-CM

## 2023-10-20 MED ORDER — SERTRALINE HCL 50 MG PO TABS: 50.0000 mg | ORAL_TABLET | Freq: Every day | ORAL | 3 refills | Status: DC

## 2023-10-20 MED ORDER — METFORMIN HCL 1000 MG PO TABS: 1000.0000 mg | ORAL_TABLET | Freq: Every day | ORAL | 1 refills | Status: AC

## 2023-10-20 MED ORDER — TIRZEPATIDE 5 MG/0.5ML ~~LOC~~ SOAJ: 5.0000 mg | SUBCUTANEOUS | 1 refills | Status: DC

## 2023-10-20 NOTE — Patient Instructions (Addendum)
 INCREASE zoloft  to 50mg  once daily  DECREASE metformin  1000mg  to once day  INCREASE Mounjaro  to 5mg  once weekly  We have given your flu vaccine today.   Follow-up with me in 2 months for medication management and lab evaluation

## 2023-10-20 NOTE — Progress Notes (Signed)
 Established Patient Office Visit  Subjective:     Patient ID: Clifford Mason, male    DOB: 12/10/1965, 58 y.o.   MRN: [id number]  No chief complaint on file.   HPI  Discussed the use of AI scribe software for clinical note transcription with the patient, who gave verbal consent to proceed.  History of Present Illness Clifford Mason is a 58 year old male with diabetes who presents for medication management and follow-up.  Glycemic control and weight management - Diabetes mellitus managed with metformin  1000 mg twice daily and Mounjaro  2.5 mg - Discontinued glipizide  - Lost 15 pounds since starting Mounjaro  - No home blood glucose monitoring yet, but has purchased a monitor - Decreased thirst and urination since starting Mounjaro  - Difficulty limiting intake of pasta and bread - Avoids sweets,  - Considering increasing Mounjaro  dose to 5 mg  Visual changes - Experienced temporary vision changes after starting Mounjaro  - Eye exam showed 20/20 vision at that time - Currently requires glasses again - Diabetic eye test was normal  Mood and antidepressant therapy - Takes sertraline  (Zoloft ) 25 mg daily without side effects - Interested in increasing dose to 50 mg  Nutritional supplementation - Takes vitamin D  50,000 IU weekly     ROS Per HPI      Objective:    BP 122/76   Pulse 73   Temp 97.6 F (36.4 C)   Ht 5' 7 (1.702 m)   Wt 228 lb 12.8 oz (103.8 kg)   SpO2 99%   BMI 35.84 kg/m    Physical Exam Vitals and nursing note reviewed.  Constitutional:      General: He is not in acute distress.    Appearance: Normal appearance. He is obese.  HENT:     Head: Normocephalic and atraumatic.     Right Ear: External ear normal.     Left Ear: External ear normal.     Nose: Nose normal.     Mouth/Throat:     Mouth: Mucous membranes are moist.     Pharynx: Oropharynx is clear.  Eyes:     Extraocular Movements: Extraocular movements intact.   Cardiovascular:     Rate and Rhythm: Normal rate and regular rhythm.     Pulses: Normal pulses.     Heart sounds: Normal heart sounds.  Pulmonary:     Effort: Pulmonary effort is normal. No respiratory distress.     Breath sounds: Normal breath sounds. No wheezing, rhonchi or rales.  Musculoskeletal:        General: Normal range of motion.     Cervical back: Normal range of motion.     Right lower leg: No edema.     Left lower leg: No edema.  Lymphadenopathy:     Cervical: No cervical adenopathy.  Skin:    General: Skin is warm and dry.  Neurological:     General: No focal deficit present.     Mental Status: He is alert and oriented to person, place, and time.  Psychiatric:        Mood and Affect: Mood normal.        Behavior: Behavior normal.     No results found for any visits on 10/20/23.  The 10-year ASCVD risk score (Arnett DK, et al., 2019) is: 12.4%  BP Readings from Last 3 Encounters:  10/20/23 122/76  09/17/23 (!) 170/110  02/01/23 (!) 140/90   Wt Readings from Last 3 Encounters:  10/20/23 228 lb 12.8 oz (103.8 kg)  09/17/23 244 lb 6.4 oz (110.9 kg)  02/01/23 240 lb (108.9 kg)      Last CBC Lab Results  Component Value Date   WBC 5.4 09/17/2023   HGB 17.1 (H) 09/17/2023   HCT 51.4 09/17/2023   MCV 88.6 09/17/2023   MCH 32.1 10/21/2016   RDW 13.3 09/17/2023   PLT 212.0 09/17/2023   Last metabolic panel Lab Results  Component Value Date   GLUCOSE 261 (H) 09/17/2023   NA 138 09/17/2023   K 4.3 09/17/2023   CL 102 09/17/2023   CO2 24 09/17/2023   BUN 9 09/17/2023   CREATININE 0.70 09/17/2023   GFR 102.13 09/17/2023   CALCIUM  9.4 09/17/2023   PROT 6.8 09/17/2023   ALBUMIN 4.5 09/17/2023   LABGLOB 3.0 10/21/2016   AGRATIO 1.2 10/21/2016   BILITOT 1.1 09/17/2023   ALKPHOS 61 09/17/2023   AST 17 09/17/2023   ALT 34 09/17/2023   Last lipids Lab Results  Component Value Date   CHOL 182 09/17/2023   HDL 48.80 09/17/2023   LDLCALC 99  09/17/2023   TRIG 168.0 (H) 09/17/2023   CHOLHDL 4 09/17/2023   Last hemoglobin A1c Lab Results  Component Value Date   HGBA1C 11.6 (H) 09/17/2023   Last thyroid functions Lab Results  Component Value Date   TSH 3.614 12/08/2014   Last vitamin D  Lab Results  Component Value Date   VD25OH 12.13 (L) 09/17/2023   Last vitamin B12 and Folate Lab Results  Component Value Date   VITAMINB12 269 09/17/2023         Assessment & Plan:   Assessment and Plan Assessment & Plan Type 2 Diabetes Mellitus with hyperglycemia, without long-term use of insulin significant improvement in weight and blood glucose control with Mounjaro . Patient reports dietary changes and weight loss. No hypoglycemic symptoms reported. -Increase Mounjaro  to 5mg  daily. -Reduce Metformin  to 1000mg  once daily. -Check A1c at end of November.  GAD, MDD, recurrent, moderate Depression:  Patient requests increase in Zoloft  dosage. No side effects reported. -Increase Zoloft  to 50mg  daily.  Essential hypertension: -Controlled today - Continue diltiazem , losartan   Morbid obesity, BMI 35: - Continue efforts in healthy diet and activity level - Has lost 15 pounds since last visit  Vitamin D  Deficiency:  Patient currently on Vitamin D  supplementation. -Continue Vitamin D  supplementation for another month.  Immunization due: -Influenza vaccine to be administered today. -Schedule follow-up appointment in two months.  Medication management - Increase Zoloft  to 50 mg once daily -Increase Mounjaro  to 5 mg once weekly - Decrease metformin  to 1000 mg once a day     Orders Placed This Encounter  Procedures   Flu vaccine trivalent PF, 6mos and older(Flulaval,Afluria,Fluarix,Fluzone)     Meds ordered this encounter  Medications   tirzepatide  (MOUNJARO ) 5 MG/0.5ML Pen    Sig: Inject 5 mg into the skin once a week.    Dispense:  6 mL    Refill:  1    Increase in dose   sertraline  (ZOLOFT ) 50 MG tablet     Sig: Take 1 tablet (50 mg total) by mouth daily.    Dispense:  30 tablet    Refill:  3   metFORMIN  (GLUCOPHAGE ) 1000 MG tablet    Sig: Take 1 tablet (1,000 mg total) by mouth daily with breakfast.    Dispense:  90 tablet    Refill:  1    Return in about 2 months (around 12/20/2023) for meds and labs.  Corean LITTIE Ku,  FNP

## 2023-11-12 ENCOUNTER — Other Ambulatory Visit: Payer: Self-pay | Admitting: Family Medicine

## 2023-11-12 DIAGNOSIS — E559 Vitamin D deficiency, unspecified: Secondary | ICD-10-CM

## 2023-11-15 ENCOUNTER — Other Ambulatory Visit: Payer: Self-pay | Admitting: Family Medicine

## 2023-11-15 DIAGNOSIS — F411 Generalized anxiety disorder: Secondary | ICD-10-CM

## 2023-12-12 ENCOUNTER — Other Ambulatory Visit: Payer: Self-pay | Admitting: Podiatry

## 2023-12-14 ENCOUNTER — Encounter: Payer: Self-pay | Admitting: Family Medicine

## 2023-12-14 ENCOUNTER — Ambulatory Visit (INDEPENDENT_AMBULATORY_CARE_PROVIDER_SITE_OTHER): Payer: PRIVATE HEALTH INSURANCE | Admitting: Family Medicine

## 2023-12-14 VITALS — BP 130/80 | HR 84 | Temp 97.8°F | Ht 67.0 in | Wt 207.0 lb

## 2023-12-14 DIAGNOSIS — E66811 Obesity, class 1: Secondary | ICD-10-CM

## 2023-12-14 DIAGNOSIS — E1165 Type 2 diabetes mellitus with hyperglycemia: Secondary | ICD-10-CM

## 2023-12-14 DIAGNOSIS — Z79899 Other long term (current) drug therapy: Secondary | ICD-10-CM | POA: Diagnosis not present

## 2023-12-14 DIAGNOSIS — K2100 Gastro-esophageal reflux disease with esophagitis, without bleeding: Secondary | ICD-10-CM

## 2023-12-14 DIAGNOSIS — Z7985 Long-term (current) use of injectable non-insulin antidiabetic drugs: Secondary | ICD-10-CM

## 2023-12-14 DIAGNOSIS — E6609 Other obesity due to excess calories: Secondary | ICD-10-CM

## 2023-12-14 DIAGNOSIS — Z6832 Body mass index (BMI) 32.0-32.9, adult: Secondary | ICD-10-CM

## 2023-12-14 DIAGNOSIS — F331 Major depressive disorder, recurrent, moderate: Secondary | ICD-10-CM

## 2023-12-14 DIAGNOSIS — I10 Essential (primary) hypertension: Secondary | ICD-10-CM

## 2023-12-14 DIAGNOSIS — F411 Generalized anxiety disorder: Secondary | ICD-10-CM

## 2023-12-14 LAB — COMPREHENSIVE METABOLIC PANEL WITH GFR
ALT: 23 U/L (ref 0–53)
AST: 17 U/L (ref 0–37)
Albumin: 4.7 g/dL (ref 3.5–5.2)
Alkaline Phosphatase: 41 U/L (ref 39–117)
BUN: 16 mg/dL (ref 6–23)
CO2: 24 meq/L (ref 19–32)
Calcium: 10 mg/dL (ref 8.4–10.5)
Chloride: 105 meq/L (ref 96–112)
Creatinine, Ser: 0.84 mg/dL (ref 0.40–1.50)
GFR: 96.5 mL/min (ref >60.00)
Glucose, Bld: 107 mg/dL — ABNORMAL HIGH (ref 70–99)
Potassium: 3.9 meq/L (ref 3.5–5.1)
Sodium: 137 meq/L (ref 135–145)
Total Bilirubin: 1.2 mg/dL (ref 0.2–1.2)
Total Protein: 6.8 g/dL (ref 6.0–8.3)

## 2023-12-14 LAB — HEMOGLOBIN A1C: Hgb A1c MFr Bld: 5.6 % (ref 4.6–6.5)

## 2023-12-14 MED ORDER — TIRZEPATIDE 7.5 MG/0.5ML ~~LOC~~ SOAJ: 7.5000 mg | SUBCUTANEOUS | 2 refills | Status: DC

## 2023-12-14 MED ORDER — FAMOTIDINE 40 MG PO TABS: 40.0000 mg | ORAL_TABLET | Freq: Two times a day (BID) | ORAL | 3 refills | Status: AC

## 2023-12-14 NOTE — Patient Instructions (Addendum)
 I have sent in Pepcid  for you to take 40 mg twice a day to help with reflux symptoms.  We have increased your Mounjaro  to 7.5 mg once a week.  Follow up with me in about 3 months for labs and medication management, sooner if needed.

## 2023-12-14 NOTE — Progress Notes (Signed)
 Established Patient Office Visit  Subjective:     Patient ID: Clifford Mason, male    DOB: 1965-12-04, 58 y.o.   MRN: [id number]  Chief Complaint  Patient presents with   Diabetes    discuss increasing Mounjaro     HPI  Discussed the use of AI scribe software for clinical note transcription with the patient, who gave verbal consent to proceed.  History of Present Illness Clifford Mason is a 58 year old male who presents for medication management and evaluation of heartburn.  Heartburn and gastrointestinal symptoms - Debilitating heartburn with recent worsening after initial improvement with dietary modifications - Over-the-counter antacids, such as Tums, are ineffective and sometimes cause rebound acid - No vomiting, hematemesis, melena, constipation, or nausea  Weight changes - Weight loss of approximately 21 pounds since last visit  Medication use - Occasional use of gabapentin , uncertain if recently added to medication list     ROS Per HPI      Objective:    BP 130/80 (BP Location: Left Arm, Patient Position: Sitting)   Pulse 84   Temp 97.8 F (36.6 C) (Temporal)   Ht 5' 7 (1.702 m)   Wt 207 lb (93.9 kg)   SpO2 97%   BMI 32.42 kg/m    Physical Exam Vitals and nursing note reviewed.  Constitutional:      General: He is not in acute distress.    Appearance: Normal appearance. He is obese.  HENT:     Head: Normocephalic and atraumatic.     Right Ear: External ear normal.     Left Ear: External ear normal.     Nose: Nose normal.     Mouth/Throat:     Mouth: Mucous membranes are moist.     Pharynx: Oropharynx is clear.  Eyes:     Extraocular Movements: Extraocular movements intact.  Cardiovascular:     Rate and Rhythm: Normal rate and regular rhythm.     Pulses: Normal pulses.     Heart sounds: Normal heart sounds.  Pulmonary:     Effort: Pulmonary effort is normal. No respiratory distress.     Breath sounds: Normal breath sounds.  No wheezing, rhonchi or rales.  Musculoskeletal:        General: Normal range of motion.     Cervical back: Normal range of motion.     Right lower leg: No edema.     Left lower leg: No edema.  Lymphadenopathy:     Cervical: No cervical adenopathy.  Skin:    General: Skin is warm and dry.  Neurological:     General: No focal deficit present.     Mental Status: He is alert and oriented to person, place, and time.  Psychiatric:        Mood and Affect: Mood normal.        Behavior: Behavior normal.     Results for orders placed or performed in visit on 12/14/23  Comprehensive metabolic panel with GFR  Result Value Ref Range   Sodium 137 135 - 145 mEq/L   Potassium 3.9 3.5 - 5.1 mEq/L   Chloride 105 96 - 112 mEq/L   CO2 24 19 - 32 mEq/L   Glucose, Bld 107 (H) 70 - 99 mg/dL   BUN 16 6 - 23 mg/dL   Creatinine, Ser 9.15 0.40 - 1.50 mg/dL   Total Bilirubin 1.2 0.2 - 1.2 mg/dL   Alkaline Phosphatase 41 39 - 117 U/L   AST 17 0 - 37  U/L   ALT 23 0 - 53 U/L   Total Protein 6.8 6.0 - 8.3 g/dL   Albumin 4.7 3.5 - 5.2 g/dL   GFR 03.49 >39.99 mL/min   Calcium  10.0 8.4 - 10.5 mg/dL  Hemoglobin J8r  Result Value Ref Range   Hgb A1c MFr Bld 5.6 4.6 - 6.5 %    The 10-year ASCVD risk score (Arnett DK, et al., 2019) is: 11.9%  BP Readings from Last 3 Encounters:  12/14/23 130/80  10/20/23 122/76  09/17/23 (!) 170/110   Wt Readings from Last 3 Encounters:  12/14/23 207 lb (93.9 kg)  10/20/23 228 lb 12.8 oz (103.8 kg)  09/17/23 244 lb 6.4 oz (110.9 kg)      Last CBC Lab Results  Component Value Date   WBC 5.4 09/17/2023   HGB 17.1 (H) 09/17/2023   HCT 51.4 09/17/2023   MCV 88.6 09/17/2023   MCH 32.1 10/21/2016   RDW 13.3 09/17/2023   PLT 212.0 09/17/2023   Last metabolic panel Lab Results  Component Value Date   GLUCOSE 107 (H) 12/14/2023   NA 137 12/14/2023   K 3.9 12/14/2023   CL 105 12/14/2023   CO2 24 12/14/2023   BUN 16 12/14/2023   CREATININE 0.84 12/14/2023    GFR 96.50 12/14/2023   CALCIUM  10.0 12/14/2023   PROT 6.8 12/14/2023   ALBUMIN 4.7 12/14/2023   LABGLOB 3.0 10/21/2016   AGRATIO 1.2 10/21/2016   BILITOT 1.2 12/14/2023   ALKPHOS 41 12/14/2023   AST 17 12/14/2023   ALT 23 12/14/2023   Last lipids Lab Results  Component Value Date   CHOL 182 09/17/2023   HDL 48.80 09/17/2023   LDLCALC 99 09/17/2023   TRIG 168.0 (H) 09/17/2023   CHOLHDL 4 09/17/2023   Last hemoglobin A1c Lab Results  Component Value Date   HGBA1C 5.6 12/14/2023   Last thyroid functions Lab Results  Component Value Date   TSH 3.614 12/08/2014   Last vitamin D  Lab Results  Component Value Date   VD25OH 12.13 (L) 09/17/2023   Last vitamin B12 and Folate Lab Results  Component Value Date   VITAMINB12 269 09/17/2023         Assessment & Plan:   Assessment and Plan Assessment & Plan Type 2 diabetes mellitus with hyperglycemia, without long-term use of insulin, long-term use other injectable antidiabetic medication Managed with Metformin  and Mounjaro . Recent weight loss noted. - Increased Mounjaro  to 7 mg. - Ordered A1c test.  Obesity, BMI 32 Recent weight loss noted. - Comorbid type 2 diabetes, HTN, anxiety and depression  Gastroesophageal reflux disease with esophagitis Heartburn improved with dietary changes but recently worsened. Tums not preferred due to rebound acid risk. - Prescribed prescription strength Pepcid  twice daily for one month, then reduce to once daily if symptoms improve.  Essential hypertension - Currently controlled, continue diltiazem , losartan   GAD, MDD, rec, mod - Stable, chronic -Continue Zoloft   Medication management -Labs today, will dose adjust medications as indicated     Orders Placed This Encounter  Procedures   Comprehensive metabolic panel with GFR    Release to patient:   Immediate [1]   Hemoglobin A1c     Meds ordered this encounter  Medications   tirzepatide  (MOUNJARO ) 7.5 MG/0.5ML Pen     Sig: Inject 7.5 mg into the skin once a week.    Dispense:  6 mL    Refill:  2   famotidine  (PEPCID ) 40 MG tablet    Sig: Take  1 tablet (40 mg total) by mouth 2 (two) times daily.    Dispense:  60 tablet    Refill:  3    Return in about 3 months (around 03/15/2024) for meds OV.  Clifford LITTIE Ku, FNP

## 2023-12-16 ENCOUNTER — Ambulatory Visit: Payer: Self-pay | Admitting: Family Medicine

## 2024-01-07 ENCOUNTER — Other Ambulatory Visit: Payer: Self-pay | Admitting: Family Medicine

## 2024-01-07 DIAGNOSIS — E559 Vitamin D deficiency, unspecified: Secondary | ICD-10-CM

## 2024-02-07 ENCOUNTER — Encounter: Payer: Self-pay | Admitting: Family Medicine

## 2024-02-08 ENCOUNTER — Other Ambulatory Visit: Payer: Self-pay

## 2024-02-08 DIAGNOSIS — E1165 Type 2 diabetes mellitus with hyperglycemia: Secondary | ICD-10-CM

## 2024-02-08 MED ORDER — TIRZEPATIDE 10 MG/0.5ML ~~LOC~~ SOAJ: 10.0000 mg | SUBCUTANEOUS | 0 refills | Status: AC

## 2024-02-10 ENCOUNTER — Encounter: Payer: Self-pay | Admitting: Family Medicine

## 2024-02-10 ENCOUNTER — Telehealth: Payer: Self-pay

## 2024-02-10 ENCOUNTER — Other Ambulatory Visit (HOSPITAL_COMMUNITY): Payer: Self-pay

## 2024-02-10 NOTE — Telephone Encounter (Signed)
 Pharmacy Patient Advocate Encounter  Received notification from [facility] that Prior Authorization for  Mounjaro  10MG /0.5ML auto-injectors  has been APPROVED from 01/21/2024 to 01/19/2025. Unable to obtain price due to refill too soon rejection, last fill date 02/08/2024 next available fill date 02/29/2024    PA #/Case ID/Reference #: [id number]

## 2024-02-10 NOTE — Telephone Encounter (Signed)
 Per [facility] team member processed for $0 copay.

## 2024-02-10 NOTE — Telephone Encounter (Signed)
 Pharmacy Patient Advocate Encounter   Received notification from Physician's Office that prior authorization for Mounjaro  10MG /0.5ML auto-injectors is required/requested.   Insurance verification completed.   The patient is insured through [facility].   Per test claim: PA required; PA submitted to above mentioned insurance via Latent Key/confirmation #/[facility] Status is pending

## 2024-02-10 NOTE — Telephone Encounter (Signed)
 Patient notified

## 2024-03-18 ENCOUNTER — Ambulatory Visit: Payer: PRIVATE HEALTH INSURANCE | Admitting: Family Medicine
